# Patient Record
Sex: Female | Born: 1960
Health system: Southern US, Community
[De-identification: ages and names within clinical notes are randomized; demographics above are authoritative.]

## PROBLEM LIST (undated history)

## (undated) DIAGNOSIS — F329 Major depressive disorder, single episode, unspecified: Secondary | ICD-10-CM

## (undated) DIAGNOSIS — F32A Depression, unspecified: Secondary | ICD-10-CM

## (undated) DIAGNOSIS — K635 Polyp of colon: Secondary | ICD-10-CM

## (undated) DIAGNOSIS — E785 Hyperlipidemia, unspecified: Secondary | ICD-10-CM

## (undated) DIAGNOSIS — F419 Anxiety disorder, unspecified: Secondary | ICD-10-CM

## (undated) HISTORY — DX: Anxiety disorder, unspecified: F41.9

## (undated) HISTORY — PX: COLON SURGERY: SHX602

## (undated) HISTORY — PX: COSMETIC SURGERY: SHX468

## (undated) HISTORY — DX: Polyp of colon: K63.5

## (undated) HISTORY — DX: Depression, unspecified: F32.A

## (undated) HISTORY — PX: AUGMENTATION MAMMAPLASTY: SUR837

## (undated) HISTORY — PX: BREAST ENHANCEMENT SURGERY: SHX7

## (undated) HISTORY — PX: LAPAROSCOPIC COLON RESECTION: SUR791

## (undated) HISTORY — DX: Hyperlipidemia, unspecified: E78.5

---
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
## 2011-04-30 HISTORY — PX: VAGINOPLASTY: SHX329

## 2011-04-30 HISTORY — PX: ORCHIECTOMY: SHX2116

## 2013-11-25 DIAGNOSIS — E785 Hyperlipidemia, unspecified: Secondary | ICD-10-CM | POA: Insufficient documentation

## 2013-11-25 DIAGNOSIS — F419 Anxiety disorder, unspecified: Secondary | ICD-10-CM | POA: Insufficient documentation

## 2013-11-25 DIAGNOSIS — D126 Benign neoplasm of colon, unspecified: Secondary | ICD-10-CM | POA: Insufficient documentation

## 2017-10-11 ENCOUNTER — Encounter (HOSPITAL_COMMUNITY): Payer: Self-pay | Admitting: Emergency Medicine

## 2017-10-11 ENCOUNTER — Other Ambulatory Visit: Payer: Self-pay

## 2017-10-11 ENCOUNTER — Emergency Department (HOSPITAL_COMMUNITY)
Admission: EM | Admit: 2017-10-11 | Discharge: 2017-10-11 | Disposition: A | Discharge status: Home / Self Care | Payer: BLUE CROSS/BLUE SHIELD | Attending: Emergency Medicine | Admitting: Emergency Medicine

## 2017-10-11 DIAGNOSIS — Y929 Unspecified place or not applicable: Secondary | ICD-10-CM | POA: Diagnosis not present

## 2017-10-11 DIAGNOSIS — Y9389 Activity, other specified: Secondary | ICD-10-CM | POA: Diagnosis not present

## 2017-10-11 DIAGNOSIS — Z23 Encounter for immunization: Secondary | ICD-10-CM | POA: Insufficient documentation

## 2017-10-11 DIAGNOSIS — Y998 Other external cause status: Secondary | ICD-10-CM | POA: Insufficient documentation

## 2017-10-11 DIAGNOSIS — S61212A Laceration without foreign body of right middle finger without damage to nail, initial encounter: Secondary | ICD-10-CM | POA: Diagnosis present

## 2017-10-11 DIAGNOSIS — W260XXA Contact with knife, initial encounter: Secondary | ICD-10-CM | POA: Diagnosis not present

## 2017-10-11 MED ORDER — TETANUS-DIPHTH-ACELL PERTUSSIS 5-2.5-18.5 LF-MCG/0.5 IM SUSP
0.5000 mL | Freq: Once | INTRAMUSCULAR | Status: AC
  Administered 2017-10-11: 0.5 mL via INTRAMUSCULAR
  Filled 2017-10-11: qty 0.5

## 2017-10-11 MED ORDER — STERILE WATER FOR IRRIGATION IR SOLN: Freq: Once | Status: DC

## 2017-10-11 MED ORDER — LIDOCAINE HCL (PF) 1 % IJ SOLN
30.0000 mL | Freq: Once | INTRAMUSCULAR | Status: AC
  Administered 2017-10-11: 30 mL
  Filled 2017-10-11: qty 30

## 2017-10-11 NOTE — Discharge Instructions (Signed)
Your laceration was repaired with sutures today. You were given a tetanus shot. Tendon do not appear to be damaged.   Your sutures need to come out within 7-10 days  The original dressing should be left in place for 24-48 hours. Laceration can then be gently cleaned with mild soap and water after 24 hours of laceration repair to prevent crusting over the suture knots. An antibiotic ointment can be applied to the wound as well, twice daily until suture removal. Keep splint on for the next 48 hours to prevent further injury and help with bleeding control.  Your sutures are nonabsorbable sutures and you may shower or wash the wound with soap and water without risking increased rates of infection or disruption of the wound.  Although not well studied, prolonged soaking of nonabsorbable stitches including swimming in chlorinated water should be avoided because of the theoretical risk of premature loss of suture tensile strength with wound dehiscence. Patients with sutures should also not swim in natural bodies of water because of a potential increased risk of infection.   Return to the ER for worsening pain, redness, swelling, pus, fevers, complete loss of sensation of finger tip

## 2017-10-11 NOTE — ED Triage Notes (Signed)
Patient laceration on middle finger on right hand, accidental from a carpet knife. Hemorrhage controlled. Patient alert, oriented, and ambulating independently with steady gait.

## 2017-10-11 NOTE — Progress Notes (Signed)
Orthopedic Tech Progress Note Patient Details:  Jaclyn Rowe 01-23-1961 [id number]  Ortho Devices Type of Ortho Device: Finger splint Ortho Device/Splint Interventions: Application   Post Interventions Patient Tolerated: Well Instructions Provided: Care of device   [place] Pink 10/11/2017, 2:33 PM

## 2017-10-11 NOTE — ED Provider Notes (Signed)
[facility] EMERGENCY DEPARTMENT Provider Note   CSN: [id number] Arrival date & time: 10/11/17  1138     History   Chief Complaint Chief Complaint  Patient presents with  . Laceration    HPI Jaclyn Rowe is a 57 y.o. female here for evaluation of laceration to the right middle finger.  Patient was using a box cutter and put her finger.  She is left-hand dominant.  She went to urgent care where they told her she had an arterial bleed and sent her to the ER for repair.  She denies any paresthesias or numbness distally.  No anticoagulants.  Unknown tetanus status. Associated with local pain. Onset: sudden.   HPI  History reviewed. No pertinent past medical history.  There are no active problems to display for this patient.   History reviewed. No pertinent surgical history.   OB History   None      Home Medications    Prior to Admission medications   Not on File    Family History No family history on file.  Social History Social History   Tobacco Use  . Smoking status: Not on file  Substance Use Topics  . Alcohol use: Not on file  . Drug use: Not on file     Allergies   Patient has no known allergies.   Review of Systems Review of Systems  Skin: Positive for wound.  All other systems reviewed and are negative.    Physical Exam Updated Vital Signs BP 123/84 (BP Location: Right Arm)   Pulse 64   Temp 98.2 F (36.8 C) (Oral)   Resp 17   SpO2 99%   Physical Exam  Constitutional: She is oriented to person, place, and time. She appears well-developed and well-nourished.  Non-toxic appearance.  HENT:  Head: Normocephalic.  Right Ear: External ear normal.  Left Ear: External ear normal.  Nose: Nose normal.  Eyes: Conjunctivae and EOM are normal.  Neck: Full passive range of motion without pain.  Cardiovascular: Normal rate.  Finger tips warm   Pulmonary/Chest: Effort normal. No tachypnea. No respiratory distress.    Musculoskeletal: Normal range of motion.  5/5 strength with right middle finger flexion and extension at PIP, DIP and MCP   Neurological: She is alert and oriented to person, place, and time.  Sensation to light touch, 2 point discrimination intact bilaterally   Skin: Skin is warm and dry. Capillary refill takes less than 2 seconds.  3 cm laceration to dorsal aspect of right middle thumb across PIP, slow pulsatile bleed from tiny artery noted.   Psychiatric: Her behavior is normal. Thought content normal.     ED Treatments / Results  Labs (all labs ordered are listed, but only abnormal results are displayed) Labs Reviewed - No data to display  EKG None  Radiology No results found.  Procedures .Marland KitchenLaceration Repair Date/Time: 10/11/2017 2:00 PM Performed by: Kinnie Feil, PA-C Authorized by: Kinnie Feil, PA-C   Consent:    Consent obtained:  Verbal   Consent given by:  Patient   Risks discussed:  Infection, pain, retained foreign body, poor cosmetic result, tendon damage and vascular damage   Alternatives discussed:  Referral Anesthesia (see MAR for exact dosages):    Anesthesia method:  Local infiltration   Local anesthetic:  Lidocaine 1% w/o epi Laceration details:    Location:  Finger   Finger location:  R long finger   Length (cm):  3 Repair type:  Repair type:  Simple Pre-procedure details:    Preparation:  Patient was prepped and draped in usual sterile fashion Exploration:    Hemostasis achieved with:  Tourniquet and direct pressure   Wound exploration: wound explored through full range of motion and entire depth of wound probed and visualized     Wound extent: vascular damage     Wound extent: no nerve damage noted and no tendon damage noted     Contaminated: no   Treatment:    Area cleansed with:  Saline and Betadine   Amount of cleaning:  Standard   Irrigation solution:  Sterile saline   Irrigation volume:  200   Irrigation method:   Pressure wash, syringe and tap   Visualized foreign bodies/material removed: no   Skin repair:    Repair method:  Sutures   Suture size:  5-0   Suture material:  Prolene   Number of sutures:  6 Approximation:    Approximation:  Close Post-procedure details:    Dressing:  Antibiotic ointment, non-adherent dressing, bulky dressing and splint for protection   Patient tolerance of procedure:  Tolerated well, no immediate complications   (including critical care time)  Medications Ordered in ED Medications  lidocaine (PF) (XYLOCAINE) 1 % injection 30 mL (has no administration in time range)  sterile water for irrigation for irrigation (has no administration in time range)  Tdap (BOOSTRIX) injection 0.5 mL (0.5 mLs Intramuscular Given 10/11/17 1252)     Initial Impression / Assessment and Plan / ED Course  I have reviewed the triage vital signs and the nursing notes.  Pertinent labs & imaging results that were available during my care of the patient were reviewed by me and considered in my medical decision making (see chart for details).    57 year old female here with laceration to the dorsal aspect of her right middle anger.  Tdap booster.  Pressurization performed.  Bottom of the wound visualized with bleeding controlled, no foreign body seen or palpated.  There was a tiny arterial slow pulsating bleed noted that was controlled with tourniquet and direct pressure.  No tendon or nerve injury noted.  Full range of motion of the affected finger with full strength and no deficits.  Laceration was repaired without immediate complications.  Patient has no comorbidities to affect normal wound healing.  Discussed procedure, care with patient and answered questions.  Patient to follow-up with primary care doctor in 7 to 10 days for suture removal.  She is hemodynamically stable.  Final Clinical Impressions(s) / ED Diagnoses   Final diagnoses:  Laceration of right middle finger without foreign  body without damage to nail, initial encounter    ED Discharge Orders    None       Kinnie Feil, PA-C 10/11/17 1404    Lajean Saver, MD 10/11/17 1445

## 2017-12-05 ENCOUNTER — Encounter: Payer: Self-pay | Admitting: Family Medicine

## 2017-12-05 ENCOUNTER — Ambulatory Visit: Payer: BLUE CROSS/BLUE SHIELD | Admitting: Family Medicine

## 2017-12-05 VITALS — BP 110/72 | HR 73 | Temp 98.4°F | Resp 16 | Ht 70.0 in | Wt 205.8 lb

## 2017-12-05 DIAGNOSIS — Z789 Other specified health status: Secondary | ICD-10-CM

## 2017-12-05 DIAGNOSIS — Z79899 Other long term (current) drug therapy: Secondary | ICD-10-CM

## 2017-12-05 DIAGNOSIS — Z79890 Hormone replacement therapy (postmenopausal): Secondary | ICD-10-CM | POA: Diagnosis not present

## 2017-12-05 DIAGNOSIS — F640 Transsexualism: Secondary | ICD-10-CM

## 2017-12-05 DIAGNOSIS — F17218 Nicotine dependence, cigarettes, with other nicotine-induced disorders: Secondary | ICD-10-CM | POA: Diagnosis not present

## 2017-12-05 MED ORDER — ESTRADIOL 2 MG PO TABS: 2.0000 mg | ORAL_TABLET | Freq: Every day | ORAL | 3 refills | Status: DC

## 2017-12-05 NOTE — Progress Notes (Addendum)
Subjective:    Patient ID: Jaclyn Rowe, female    DOB: October 20, 1960, 57 y.o.   MRN: [id number] Chief Complaint  Patient presents with  . New pt  . Medication Refill    Estradiol 2 mg    HPI  Zamira is a delightful 57 yo woman who is here today to establish care.   She is a transgender female (assigned female at birth) who has had her gender-affirming hormones managed/rx'd by Dr. Vella Raring (ob-gyn in [place]) for the past 8 yrs until his retirement 4 mos ago.  She was initially on 4mg  of estradiol but dropped down to estradiol 2mg  qhs as her maintanence dose ever since her gender-affirming "bottom" surgery in 2013. Also s/p "top" surgery  Due for colonoscoy - prev was done at [facility] - had tubulovillous adenoma on last colonoscopy - but ok w/ going elsewhere - staying in [facility] now  PCP has been Dr. Tiana Loft in [facility] (google states Dr. Valora Piccolo is internist at [place]) but as Dr. Valora Piccolo does not rx gender-affirming hormone therapy Herlinda would prefer to just transfer here where she can receive both primary care and gender-affirming hormones rx from myself.  Tobacco abuse:  Did prev quit X 4 YRS when she initially started HRT bu using nicotine patches and gum - but then restarted due to stress from job - and now smoking up to 1.5 ppd. Never tried any chantix or wellbutrin prior.   Has been on lexapro 20 for years for anxiety - working well - no desire to change.  No past medical history on file. Past Surgical History:  Procedure Laterality Date  . COLON SURGERY    . COSMETIC SURGERY     Current Outpatient Medications on File Prior to Visit  Medication Sig Dispense Refill  . escitalopram (LEXAPRO) 10 MG tablet Take 10 mg by mouth daily.    . pravastatin (PRAVACHOL) 80 MG tablet Take 80 mg by mouth daily.     No current facility-administered medications on file prior to visit.    No Known Allergies No family history on  file. Social History   Socioeconomic History  . Marital status: Married    Spouse name: Not on file  . Number of children: Not on file  . Years of education: Not on file  . Highest education level: Not on file  Occupational History  . Not on file  Social Needs  . Financial resource strain: Not on file  . Food insecurity:    Worry: Not on file    Inability: Not on file  . Transportation needs:    Medical: Not on file    Non-medical: Not on file  Tobacco Use  . Smoking status: Current Every Day Smoker  . Smokeless tobacco: Never Used  Substance and Sexual Activity  . Alcohol use: Yes    Alcohol/week: 5.0 standard drinks    Types: 5 Standard drinks or equivalent per week  . Drug use: Not on file  . Sexual activity: Not on file  Lifestyle  . Physical activity:    Days per week: Not on file    Minutes per session: Not on file  . Stress: Not on file  Relationships  . Social connections:    Talks on phone: Not on file    Gets together: Not on file    Attends religious service: Not on file    Active member of club or organization: Not on file  Attends meetings of clubs or organizations: Not on file    Relationship status: Not on file  Other Topics Concern  . Not on file  Social History Narrative  . Not on file   No flowsheet data found.   Review of Systems See hpi    Objective:   Physical Exam  Constitutional: She is oriented to person, place, and time. She appears well-developed and well-nourished. No distress.  HENT:  Head: Normocephalic and atraumatic.  Right Ear: External ear normal.  Left Ear: External ear normal.  Eyes: Conjunctivae are normal. No scleral icterus.  Neck: Normal range of motion. Neck supple. No thyromegaly present.  Cardiovascular: Normal rate, regular rhythm, normal heart sounds and intact distal pulses.  Pulmonary/Chest: Effort normal and breath sounds normal. No respiratory distress.  Musculoskeletal: She exhibits no edema.   Lymphadenopathy:    She has no cervical adenopathy.  Neurological: She is alert and oriented to person, place, and time.  Skin: Skin is warm and dry. She is not diaphoretic. No erythema.  Psychiatric: She has a normal mood and affect. Her behavior is normal.   BP 110/72 (BP Location: Left Arm, Patient Position: Sitting, Cuff Size: Normal)   Pulse 73   Temp 98.4 F (36.9 C) (Oral)   Resp 16   Ht 5\' 10"  (1.778 m)   Wt 205 lb 12.8 oz (93.4 kg)   SpO2 96%   BMI 29.53 kg/m      Assessment & Plan:  Over 30 min spent in face-to-face evaluation of and consultation with patient and coordination of care.  Over 50% of this time was spent counseling this patient regarding above - risks/benefits o gender-affirming hormone therapy, esp post-gonadectomy with vigilant need to avoid osteoporosis.  1. Cigarette nicotine dependence with other nicotine-induced disorder - strongly encouraged cessation -estrogen dependence put pt at sig increased risk of DVT/PE reviewed in additional to the usu complicaitons - pt pre-contemplative due to stress of job - is likely candidate for screening lung CT - discuss at f/u and assess total pack yrs (though has [place] ins which often doesn't cover this under preventative in past experience)  2. Encounter for long-term current use of high risk medication   3. Hormone replacement therapy (HRT) - stable on estradiol 2mg  qhs since her orchiectomy in 2013 - 6 yrs - refilled. ADDENDUM: May consider increasing estradiol - in appropriate middle of post-menopausal range but likely has been at this level since ~57 yo and lower level may increase risk of developing osteoporosis so could do trial of 3mg  if pt prefers but would want her to quit smoking first since higher estrogen dose will also increase dvt/pe risk.  4. Female-to-female transgender person    Asked pt to sign release to get records from prior PCP Dr. Valora Piccolo and Dr. Vella Raring - gender specialist/ob-gyn.  Orders Placed This  Encounter  Procedures  . CBC with Differential/Platelet  . Comprehensive metabolic panel  . TestT+TestF+SHBG  . Estradiol    Meds ordered this encounter  Medications  . estradiol (ESTRACE) 2 MG tablet    Sig: Take 1 tablet (2 mg total) by mouth daily.    Dispense:  90 tablet    Refill:  3    Delman Cheadle, MD, MPH Primary Care at [place], [place]  [address] [phone] Office phone  [phone] Office fax   02/16/18 7:33 AM

## 2017-12-05 NOTE — Patient Instructions (Addendum)
Please stop by the front desk and sign a release so we can get your records from BOTH Dr. Vella Raring and Dr. Valora Piccolo in [facility].    IF you received an x-ray today, you will receive an invoice from [facility]. Please contact [facility] at [phone] with questions or concerns regarding your invoice.   IF you received labwork today, you will receive an invoice from [facility]. Please contact [facility] at [phone] with questions or concerns regarding your invoice.   Our billing staff will not be able to assist you with questions regarding bills from these companies.  You will be contacted with the lab results as soon as they are available. The fastest way to get your results is to activate your My Chart account. Instructions are located on the last page of this paperwork. If you have not heard from [place] regarding the results in 2 weeks, please contact this office.     Tobacco Use Disorder Tobacco use disorder (TUD) is a mental disorder. It is the long-term use of tobacco in spite of related health problems or difficulty with normal life activities. Tobacco is most commonly smoked as cigarettes and less commonly as cigars or pipes. Smokeless chewing tobacco and snuff are also popular. People with TUD get a feeling of extreme pleasure (euphoria) from using tobacco and have a desire to use it again and again. Repeated use of tobacco can cause problems. The addictive effects of tobacco are due mainly tothe ingredient nicotine. Nicotine also causes a rush of adrenaline (epinephrine) in the body. This leads to increased blood pressure, heart rate, and breathing rate. These changes may cause problems for people with high blood pressure, weak hearts, or lung disease. High doses of nicotine in children and pets can lead to seizures and death. Tobacco contains a number of other unsafe chemicals. These chemicals are especially harmful when inhaled as smoke and can damage almost every organ in  the body. Smokers live shorter lives than nonsmokers and are at risk of dying from a number of diseases and cancers. Tobacco smoke can also cause health problems for nonsmokers (due to inhaling secondhand smoke). Smoking is also a fire hazard. TUD usually starts in the late teenage years and is most common in young adults between the ages of 29 and 39 years. People who start smoking earlier in life are more likely to continue smoking as adults. TUD is somewhat more common in men than women. People with TUD are at higher risk for using alcohol and other drugs of abuse. What increases the risk? Risk factors for TUD include:  Having family members with the disorder.  Being around people who use tobacco.  Having an existing mental health issue such as schizophrenia, depression, bipolar disorder, ADHD, or posttraumatic stress disorder (PTSD).  What are the signs or symptoms? People with tobacco use disorder have two or more of the following signs and symptoms within 12 months:  Use of more tobacco over a longer period than intended.  Not able to cut down or control tobacco use.  A lot of time spent obtaining or using tobacco.  Strong desire or urge to use tobacco (craving). Cravings may last for 6 months or longer after quitting.  Use of tobacco even when use leads to major problems at work, school, or home.  Use of tobacco even when use leads to relationship problems.  Giving up or cutting down on important life activities because of tobacco use.  Repeatedly using tobacco in situations where it  puts you or others in physical danger, like smoking in bed.  Use of tobacco even when it is known that a physical or mental problem is likely related to tobacco use. ? Physical problems are numerous and may include chronic bronchitis, emphysema, lung and other cancers, gum disease, high blood pressure, heart disease, and stroke. ? Mental problems caused by tobacco may include difficulty sleeping  and anxiety.  Need to use greater amounts of tobacco to get the same effect. This means you have developed a tolerance.  Withdrawal symptoms as a result of stopping or rapidly cutting back use. These symptoms may last a month or more after quitting and include the following: ? Depressed, anxious, or irritable mood. ? Difficulty concentrating. ? Increased appetite. ? Restlessness or trouble sleeping. ? Use of tobacco to avoid withdrawal symptoms.  How is this diagnosed? Tobacco use disorder is diagnosed by your health care provider. A diagnosis may be made by:  Your health care provider asking questions about your tobacco use and any problems it may be causing.  A physical exam.  Lab tests.  You may be referred to a mental health professional or addiction specialist.  The severity of tobacco use disorder depends on the number of signs and symptoms you have:  Mild-Two or three symptoms.  Moderate-Four or five symptoms.  Severe-Six or more symptoms.  How is this treated? Many people with tobacco use disorder are unable to quit on their own and need help. Treatment options include the following:  Nicotine replacement therapy (NRT). NRT provides nicotine without the other harmful chemicals in tobacco. NRT gradually lowers the dosage of nicotine in the body and reduces withdrawal symptoms. NRT is available in over-the-counter forms (gum, lozenges, and skin patches) as well as prescription forms (mouth inhaler and nasal spray).  Medicines.This may include: ? Antidepressant medicine that may reduce nicotine cravings. ? A medicine that acts on nicotine receptors in the brain to reduce cravings and withdrawal symptoms. It may also block the effects of tobacco in people with TUD who relapse.  Counseling or talk therapy. A form of talk therapy called behavioral therapy is commonly used to treat people with TUD. Behavioral therapy looks at triggers for tobacco use, how to avoid them, and  how to cope with cravings. It is most effective in person or by phone but is also available in self-help forms (books and Internet websites).  Support groups. These provide emotional support, advice, and guidance for quitting tobacco.  The most effective treatment for TUD is usually a combination of medicine, talk therapy, and support groups. Follow these instructions at home:  Keep all follow-up visits as directed by your health care provider. This is important.  Take medicines only as directed by your health care provider.  Check with your health care provider before starting new prescription or over-the-counter medicines. Contact a health care provider if:  You are not able to take your medicines as prescribed.  Treatment is not helping your TUD and your symptoms get worse. Get help right away if:  You have serious thoughts about hurting yourself or others.  You have trouble breathing, chest pain, sudden weakness, or sudden numbness in part of your body. This information is not intended to replace advice given to you by your health care provider. Make sure you discuss any questions you have with your health care provider. Document Released: 12/20/2003 Document Revised: 12/17/2015 Document Reviewed: 06/11/2013 Elsevier Interactive Patient Education  [employer].

## 2017-12-08 LAB — CBC WITH DIFFERENTIAL/PLATELET
BASOS: 0 %
Basophils Absolute: 0 10*3/uL (ref 0.0–0.2)
EOS (ABSOLUTE): 0.2 10*3/uL (ref 0.0–0.4)
EOS: 2 %
HEMATOCRIT: 44.6 % (ref 34.0–46.6)
Hemoglobin: 15.4 g/dL (ref 11.1–15.9)
IMMATURE GRANS (ABS): 0 10*3/uL (ref 0.0–0.1)
IMMATURE GRANULOCYTES: 0 %
Lymphocytes Absolute: 1.5 10*3/uL (ref 0.7–3.1)
Lymphs: 24 %
MCH: 32.9 pg (ref 26.6–33.0)
MCHC: 34.5 g/dL (ref 31.5–35.7)
MCV: 95 fL (ref 79–97)
MONOS ABS: 0.6 10*3/uL (ref 0.1–0.9)
Monocytes: 9 %
NEUTROS ABS: 4 10*3/uL (ref 1.4–7.0)
NEUTROS PCT: 65 %
Platelets: 226 10*3/uL (ref 150–450)
RBC: 4.68 x10E6/uL (ref 3.77–5.28)
RDW: 13.5 % (ref 12.3–15.4)
WBC: 6.2 10*3/uL (ref 3.4–10.8)

## 2017-12-08 LAB — COMPREHENSIVE METABOLIC PANEL
A/G RATIO: 1.9 (ref 1.2–2.2)
ALT: 11 IU/L (ref 0–32)
AST: 13 IU/L (ref 0–40)
Albumin: 4.3 g/dL (ref 3.5–5.5)
Alkaline Phosphatase: 56 IU/L (ref 39–117)
BUN/Creatinine Ratio: 18 (ref 9–23)
BUN: 15 mg/dL (ref 6–24)
Bilirubin Total: 0.3 mg/dL (ref 0.0–1.2)
CALCIUM: 9.2 mg/dL (ref 8.7–10.2)
CO2: 22 mmol/L (ref 20–29)
Chloride: 104 mmol/L (ref 96–106)
Creatinine, Ser: 0.84 mg/dL (ref 0.57–1.00)
GFR calc Af Amer: 90 mL/min/{1.73_m2} (ref >59)
GFR, EST NON AFRICAN AMERICAN: 78 mL/min/{1.73_m2} (ref >59)
Globulin, Total: 2.3 g/dL (ref 1.5–4.5)
Glucose: 84 mg/dL (ref 65–99)
POTASSIUM: 4.1 mmol/L (ref 3.5–5.2)
SODIUM: 141 mmol/L (ref 134–144)
TOTAL PROTEIN: 6.6 g/dL (ref 6.0–8.5)

## 2017-12-08 LAB — TESTT+TESTF+SHBG
Sex Hormone Binding: 72.9 nmol/L (ref 17.3–125.0)
Testosterone, Free: 2.8 pg/mL (ref 0.0–4.2)
Testosterone, total: 25 ng/dL

## 2017-12-08 LAB — ESTRADIOL: Estradiol: 25.9 pg/mL

## 2018-02-17 ENCOUNTER — Other Ambulatory Visit: Payer: Self-pay | Admitting: Family Medicine

## 2018-02-17 NOTE — Telephone Encounter (Signed)
Copied from [place] [id number]. Topic: Quick Communication - Rx Refill/Question >> Feb 17, 2018 11:35 AM Leward Quan A wrote: Medication: escitalopram (LEXAPRO) 10 MG tablet  Patient is completely out of medication  Has the patient contacted their pharmacy? Yes.     Preferred Pharmacy (with phone number or street name): [id number], [place] [phone] (Phone) [phone] (Fax)   Agent: Please be advised that RX refills may take up to 3 business days. We ask that you follow-up with your pharmacy.

## 2018-02-17 NOTE — Telephone Encounter (Signed)
Rx request of historical medication: Lexapro 10 mg    Historical provider  LOV: 12/05/17  PCP: [place]: verified

## 2018-02-18 ENCOUNTER — Other Ambulatory Visit: Payer: Self-pay | Admitting: Family Medicine

## 2018-02-19 ENCOUNTER — Other Ambulatory Visit: Payer: Self-pay | Admitting: Family Medicine

## 2018-02-19 NOTE — Telephone Encounter (Signed)
Requested medication (s) are due for refill today: yes  Requested medication (s) are on the active medication list: yes  Last refill:  Last filled by historical provider  Future visit scheduled: yes 06/08/18  Notes to clinic:  Medication last filled by historical provider. LOV: 12/05/17    Requested Prescriptions  Pending Prescriptions Disp Refills   escitalopram (LEXAPRO) 20 MG tablet [Pharmacy Med Name: ESCITALOPRAM 20MG  TABLETS] 90 tablet 0    Sig: TAKE 1 TABLET BY MOUTH DAILY     Psychiatry:  Antidepressants - SSRI Passed - 02/19/2018  5:05 PM      Passed - Valid encounter within last 6 months    Recent Outpatient Visits          2 months ago Tobacco abuse   [facility] at [facility], Laurey Arrow, MD      Future Appointments            In 3 months Shawnee Knapp, MD Primary Care at [place], [facility]

## 2018-02-20 ENCOUNTER — Telehealth: Payer: Self-pay

## 2018-02-20 MED ORDER — ESCITALOPRAM OXALATE 10 MG PO TABS: 10.0000 mg | ORAL_TABLET | Freq: Every day | ORAL | 1 refills | Status: DC

## 2018-02-20 NOTE — Telephone Encounter (Signed)
Refill x 6 mos sent to [facility] in [place]

## 2018-02-20 NOTE — Telephone Encounter (Signed)
Pharmacy would like clarification on Lexapro refill.  Pt has been on 20mg  through another practice for at least one year.  Refill was submitted for 10mg .  Please clarify which dose is correct.

## 2018-02-21 ENCOUNTER — Encounter: Payer: Self-pay | Admitting: Family Medicine

## 2018-02-21 DIAGNOSIS — Z79899 Other long term (current) drug therapy: Secondary | ICD-10-CM | POA: Insufficient documentation

## 2018-02-21 DIAGNOSIS — F17200 Nicotine dependence, unspecified, uncomplicated: Secondary | ICD-10-CM | POA: Insufficient documentation

## 2018-02-21 DIAGNOSIS — Z789 Other specified health status: Secondary | ICD-10-CM | POA: Insufficient documentation

## 2018-02-21 DIAGNOSIS — F640 Transsexualism: Secondary | ICD-10-CM | POA: Insufficient documentation

## 2018-02-21 DIAGNOSIS — Z79890 Hormone replacement therapy (postmenopausal): Secondary | ICD-10-CM | POA: Insufficient documentation

## 2018-02-21 MED ORDER — ESCITALOPRAM OXALATE 20 MG PO TABS: 20.0000 mg | ORAL_TABLET | Freq: Every day | ORAL | 1 refills | Status: DC

## 2018-02-21 NOTE — Telephone Encounter (Addendum)
[facility] pharmacy called - I was just intending to refill her current rx per the 10/22 refill request I was sent.   Looks like the med dose must have been entered wrong when she was triaged at her last visit and then not clarified when she called in for refill. Sent in new rx to pharm for lexapro 20mg  qd x 6 mos and cancelled the 10.

## 2018-02-21 NOTE — Addendum Note (Signed)
Addended by: Shawnee Knapp on: 02/21/2018 02:52 AM   Modules accepted: Orders

## 2018-02-23 ENCOUNTER — Encounter: Payer: Self-pay | Admitting: *Deleted

## 2018-03-18 ENCOUNTER — Telehealth: Payer: Self-pay | Admitting: Family Medicine

## 2018-03-18 NOTE — Telephone Encounter (Signed)
Jaclyn Rowe alerted me that pt has been having a hard time getting through the portal and needs to speak with me about her lexapro as it needs to be increased (been on same dosage for years and now ineffective, she could probably use some Buspar. She is doing EMDR with Dawn at [place].  I noted that I would f/u.  No one is signed on pt's release so cannot share info w/ [name]. Pt noted ok to leave detailed message on her cell phone [phone] so called and LVM that I was told to reach out to pt about her worsening anxiety to see if there was a quick intervention/med change we could do now and then f/u on effects and next steps at her OV sched for 12/20. Asked pt to call back or MyChart in with good time to reach her. Unfortunately, likely can't get pt in for an OV prior to that.  Also alerted pt that I would text her info for MyChart app activation and did so.  --------------------------------------------- Please talk to pt and let her know: On max dose lexapro now as was increased back to 20mg  qd last mo at pt's request when she called in for refill (had prev been decreased to 10 at pt's last and only OV w/ me 11/2017 when she reporteded her lexapro was working well and anxiety well controlled) Want to make sure pt received the 20mg  dose now and if she is still having problems with anxiety mainly (rahter than depression), trial of low dose buspar 5mg  bid is reasonable - increase to tid after 1 wk if tolerated.  Then at upcoming visit can discuss tapering off of lexapro and onto something else and/or increasing buspar further.

## 2018-03-19 ENCOUNTER — Encounter: Payer: Self-pay | Admitting: Family Medicine

## 2018-03-19 NOTE — Telephone Encounter (Signed)
LVM for pt to call back within 5 minutes (from 10:00 AM) in order to schedule an appt with Dr. Brigitte Pulse today 03/19/18 at 1:30 due to a cancellation. I advised on VM that I would have to move on to another pt if I didn't hear back within 5 minutes.

## 2018-03-25 ENCOUNTER — Encounter: Payer: Self-pay | Admitting: Family Medicine

## 2018-04-17 ENCOUNTER — Encounter: Payer: Self-pay | Admitting: Family Medicine

## 2018-04-17 ENCOUNTER — Other Ambulatory Visit: Payer: Self-pay

## 2018-04-17 ENCOUNTER — Ambulatory Visit (INDEPENDENT_AMBULATORY_CARE_PROVIDER_SITE_OTHER): Payer: BLUE CROSS/BLUE SHIELD | Admitting: Family Medicine

## 2018-04-17 VITALS — BP 105/69 | HR 95 | Temp 98.0°F | Resp 16 | Ht 69.29 in | Wt 207.0 lb

## 2018-04-17 DIAGNOSIS — F17218 Nicotine dependence, cigarettes, with other nicotine-induced disorders: Secondary | ICD-10-CM | POA: Diagnosis not present

## 2018-04-17 DIAGNOSIS — Z113 Encounter for screening for infections with a predominantly sexual mode of transmission: Secondary | ICD-10-CM

## 2018-04-17 DIAGNOSIS — F331 Major depressive disorder, recurrent, moderate: Secondary | ICD-10-CM | POA: Diagnosis not present

## 2018-04-17 DIAGNOSIS — F17200 Nicotine dependence, unspecified, uncomplicated: Secondary | ICD-10-CM | POA: Diagnosis not present

## 2018-04-17 DIAGNOSIS — F640 Transsexualism: Secondary | ICD-10-CM

## 2018-04-17 DIAGNOSIS — Z79899 Other long term (current) drug therapy: Secondary | ICD-10-CM

## 2018-04-17 DIAGNOSIS — F419 Anxiety disorder, unspecified: Secondary | ICD-10-CM | POA: Diagnosis not present

## 2018-04-17 DIAGNOSIS — Z789 Other specified health status: Secondary | ICD-10-CM

## 2018-04-17 DIAGNOSIS — D126 Benign neoplasm of colon, unspecified: Secondary | ICD-10-CM

## 2018-04-17 DIAGNOSIS — Z114 Encounter for screening for human immunodeficiency virus [HIV]: Secondary | ICD-10-CM

## 2018-04-17 DIAGNOSIS — Z1329 Encounter for screening for other suspected endocrine disorder: Secondary | ICD-10-CM

## 2018-04-17 MED ORDER — BUPROPION HCL ER (SR) 150 MG PO TB12: 150.0000 mg | ORAL_TABLET | Freq: Two times a day (BID) | ORAL | 1 refills | Status: DC

## 2018-04-17 NOTE — Progress Notes (Signed)
Subjective:    Patient: Jaclyn Rowe  DOB: 11/16/1960; 57 y.o.   MRN: [id number]  Chief Complaint  Patient presents with  . Medication Management    possible increase of the Lexapro     HPI  Still has some lingering depression - blah, lethargic, thoughts depressed - not her. EMDR is magic - helping tremendously with past trauma and anxiety but has seemed to reach a plateau. Seeing Dawn at [facility]. Thinking with all the trauma was normal - has been schlepping around baggage which is now removed and so noticing that is left still with some mild depression.  Not enjoying things she used to enjoy - not playing guitar or riding motorcycles - doesn't feel like it - impending sense of doom and gloom. Sleeping as physically tired.  Sometimes sleeping more to escape. Concentration normal but is more irritable. Appetite ok.  Anxiety related to high stress situations.   Has been on buspirone prior which didn't work.  Patient did try BuSpar 7.5 mg twice daily With Dr. Valora Piccolo in February 2016.  Hep C screen neg 11/02/15  Still smoking the smoking 1 1/2 ppd. She did stop smoking for 4 years by cold [place] around 2017.  She gets a rash with nicotine patches and has been afraid to try Chantix due to the side effect profile.    Her prior PCP Dr. Valora Piccolo did refer her to gastroenterology as she was due for colon cancer screening at her last visit with him 07/16/2017 noting that prior colonoscopy had been done 01/24/2014  Medical History History reviewed. No pertinent past medical history. Past Surgical History:  Procedure Laterality Date  . BREAST ENHANCEMENT SURGERY Bilateral   . COLON SURGERY    . COSMETIC SURGERY    . ORCHIECTOMY Bilateral 2013  . VAGINOPLASTY  2013   Current Outpatient Medications on File Prior to Visit  Medication Sig Dispense Refill  . escitalopram (LEXAPRO) 20 MG tablet Take 1 tablet (20 mg total) by mouth daily. 90 tablet 1  . estradiol (ESTRACE) 2 MG tablet Take 1 tablet  (2 mg total) by mouth daily. 90 tablet 3  . pravastatin (PRAVACHOL) 80 MG tablet Take 80 mg by mouth daily.     No current facility-administered medications on file prior to visit.    No Known Allergies History reviewed. No pertinent family history. Social History   Socioeconomic History  . Marital status: Married    Spouse name: Not on file  . Number of children: Not on file  . Years of education: Not on file  . Highest education level: Not on file  Occupational History  . Not on file  Social Needs  . Financial resource strain: Not on file  . Food insecurity:    Worry: Not on file    Inability: Not on file  . Transportation needs:    Medical: Not on file    Non-medical: Not on file  Tobacco Use  . Smoking status: Current Every Day Smoker    Packs/day: 1.50  . Smokeless tobacco: Never Used  Substance and Sexual Activity  . Alcohol use: Yes    Alcohol/week: 5.0 standard drinks    Types: 5 Standard drinks or equivalent per week  . Drug use: Not on file  . Sexual activity: Not on file  Lifestyle  . Physical activity:    Days per week: Not on file    Minutes per session: Not on file  . Stress: Not on file  Relationships  .  Social connections:    Talks on phone: Not on file    Gets together: Not on file    Attends religious service: Not on file    Active member of club or organization: Not on file    Attends meetings of clubs or organizations: Not on file    Relationship status: Not on file  Other Topics Concern  . Not on file  Social History Narrative  . Not on file   Depression screen [facility] 2/9 04/17/2018  Decreased Interest 2  Down, Depressed, Hopeless 2  PHQ - 2 Score 4  Altered sleeping 0  Tired, decreased energy 1  Change in appetite 0  Feeling bad or failure about yourself  2  Trouble concentrating 0  Moving slowly or fidgety/restless 0  Suicidal thoughts 0  PHQ-9 Score 7    ROS As noted in HPI  Objective:  BP 105/69   Pulse 95   Temp 98 F  (36.7 C) (Oral)   Resp 16   Ht 5' 9.29" (1.76 m)   Wt 207 lb (93.9 kg)   SpO2 99%   BMI 30.31 kg/m  Physical Exam Constitutional:      General: She is not in acute distress.    Appearance: She is well-developed. She is not diaphoretic.  HENT:     Head: Normocephalic and atraumatic.     Right Ear: External ear normal.     Left Ear: External ear normal.  Eyes:     General: No scleral icterus.    Conjunctiva/sclera: Conjunctivae normal.  Neck:     Musculoskeletal: Normal range of motion and neck supple.     Thyroid: No thyromegaly.  Cardiovascular:     Rate and Rhythm: Normal rate and regular rhythm.     Heart sounds: Normal heart sounds.  Pulmonary:     Effort: Pulmonary effort is normal. No respiratory distress.     Breath sounds: Normal breath sounds.  Lymphadenopathy:     Cervical: No cervical adenopathy.  Skin:    General: Skin is warm and dry.     Findings: No erythema.  Neurological:     Mental Status: She is alert and oriented to person, place, and time.  Psychiatric:        Behavior: Behavior normal.      Assessment & Plan:   1. Moderate episode of recurrent major depressive disorder ([place])   2. Anxiety   3. Tobacco use disorder   4. Cigarette nicotine dependence with other nicotine-induced disorder   5. Routine screening for STI (sexually transmitted infection)   6. Screening for HIV (human immunodeficiency virus)   7. Screening for thyroid disorder   8. Encounter for long-term current use of high risk medication   9. Female-to-female transgender person   67. Tubulovillous adenoma of colon     Patient will continue on current chronic medications other than changes noted above, so ok to refill when needed.   See after visit summary for patient specific instructions.  Orders Placed This Encounter  Procedures  . HIV Antibody (routine testing w rflx)    Standing Status:   Future    Standing Expiration Date:   04/18/2019  . TSH    Standing Status:    Future    Standing Expiration Date:   04/18/2019  . Ambulatory Referral for Lung Cancer Scre    Referral Priority:   Routine    Referral Type:   Consultation    Referral Reason:   Specialty Services Required  Number of Visits Requested:   1    Meds ordered this encounter  Medications  . buPROPion (WELLBUTRIN SR) 150 MG 12 hr tablet    Sig: Take 1 tablet (150 mg total) by mouth 2 (two) times daily.    Dispense:  60 tablet    Refill:  1    Patient verbalized to me that they understand the following: diagnosis, what is being done for them, what to expect and what should be done at home.  Their questions have been answered. They understand that I am unable to predict every possible medication interaction or adverse outcome and that if any unexpected symptoms arise, they should contact us and their pharmacist, as well as never hesitate to seek urgent/emergent care at [facility] or ER if they think it might be warranted.    Delman Cheadle, MD, MPH Primary Care at [place] [address] [place], [place]  [address] [phone] Office phone  [phone] Office fax  04/17/18 10:34 AM

## 2018-04-17 NOTE — Patient Instructions (Signed)
° ° ° °  If you have lab work done today you will be contacted with your lab results within the next 2 weeks.  If you have not heard from us then please contact us. The fastest way to get your results is to register for My Chart. ° ° °IF you received an x-ray today, you will receive an invoice from [facility]. Please contact [facility] at [phone] with questions or concerns regarding your invoice.  ° °IF you received labwork today, you will receive an invoice from [facility]. Please contact [facility] at [phone] with questions or concerns regarding your invoice.  ° °Our billing staff will not be able to assist you with questions regarding bills from these companies. ° °You will be contacted with the lab results as soon as they are available. The fastest way to get your results is to activate your My Chart account. Instructions are located on the last page of this paperwork. If you have not heard from us regarding the results in 2 weeks, please contact this office. °  ° ° ° °

## 2018-05-15 ENCOUNTER — Telehealth: Payer: Self-pay | Admitting: Family Medicine

## 2018-05-15 NOTE — Telephone Encounter (Signed)
MyChart message sent to pt about their appointment on 06/08/18 with Dr Brigitte Pulse

## 2018-05-29 ENCOUNTER — Ambulatory Visit: Payer: BLUE CROSS/BLUE SHIELD | Admitting: Family Medicine

## 2018-06-07 ENCOUNTER — Other Ambulatory Visit: Payer: Self-pay | Admitting: Family Medicine

## 2018-06-08 ENCOUNTER — Encounter: Payer: BLUE CROSS/BLUE SHIELD | Admitting: Family Medicine

## 2018-06-08 NOTE — Telephone Encounter (Signed)
Requested Prescriptions  Pending Prescriptions Disp Refills  . buPROPion (WELLBUTRIN SR) 150 MG 12 hr tablet [Pharmacy Med Name: BUPROPION SR 150MG  TABLETS (12 H)] 60 tablet 1    Sig: TAKE 1 TABLET(150 MG) BY MOUTH TWICE DAILY     Psychiatry: Antidepressants - bupropion Passed - 06/07/2018 11:04 AM      Passed - Last BP in normal range    BP Readings from Last 1 Encounters:  04/17/18 105/69         Passed - Valid encounter within last 6 months    Recent Outpatient Visits          1 month ago Moderate episode of recurrent major depressive disorder [facility])   [facility], Laurey Arrow, MD   6 months ago Cigarette nicotine dependence with other nicotine-induced disorder   [facility], Laurey Arrow, MD

## 2018-06-15 NOTE — Telephone Encounter (Signed)
Requested medication (s) are due for refill today: not sure  Requested medication (s) are on the active medication list: yes  Last refill:  No date  Future visit scheduled: no  Notes to clinic:  antilipid statins failed. Prescribed by historical provider.   Requested Prescriptions  Pending Prescriptions Disp Refills   pravastatin (PRAVACHOL) 80 MG tablet      Sig: Take 1 tablet (80 mg total) by mouth daily.     Cardiovascular:  Antilipid - Statins Failed - 06/15/2018 11:36 AM      Failed - Total Cholesterol in normal range and within 360 days    No results found for: CHOL, POCCHOL       Failed - LDL in normal range and within 360 days    No results found for: LDLCALC, LDLC, HIRISKLDL       Failed - HDL in normal range and within 360 days    No results found for: HDL       Failed - Triglycerides in normal range and within 360 days    No results found for: TRIG       Passed - Patient is not pregnant      Passed - Valid encounter within last 12 months    Recent Outpatient Visits          1 month ago Moderate episode of recurrent major depressive disorder [facility])   [facility] at [facility], Laurey Arrow, MD   6 months ago Cigarette nicotine dependence with other nicotine-induced disorder   [facility] at [facility], Laurey Arrow, MD           Signed Prescriptions Disp Refills   buPROPion (WELLBUTRIN SR) 150 MG 12 hr tablet 60 tablet 1    Sig: TAKE 1 TABLET(150 MG) BY MOUTH TWICE DAILY     Psychiatry: Antidepressants - bupropion Passed - 06/07/2018 11:04 AM      Passed - Last BP in normal range    BP Readings from Last 1 Encounters:  04/17/18 105/69         Passed - Valid encounter within last 6 months    Recent Outpatient Visits          1 month ago Moderate episode of recurrent major depressive disorder [facility])   Primary Care at [place], MD   6 months ago Cigarette nicotine dependence with other nicotine-induced disorder   [facility] at [facility], Laurey Arrow, MD

## 2018-06-15 NOTE — Telephone Encounter (Signed)
Copied from [place] [id number]. Topic: Quick Communication - Rx Refill/Question >> Jun 15, 2018 11:17 AM Lionel December wrote: Medication: pravastatin (PRAVACHOL) 80 MG tablet  Has the patient contacted their pharmacy? Yes.   (Agent: If no, request that the patient contact the pharmacy for the refill.) (Agent: If yes, when and what did the pharmacy advise?)  Preferred Pharmacy (with phone number or street name): [id number] [place] [phone] (Phone) [phone] (Fax)    Agent: Please be advised that RX refills may take up to 3 business days. We ask that you follow-up with your pharmacy.

## 2018-06-16 MED ORDER — PRAVASTATIN SODIUM 80 MG PO TABS: 80.0000 mg | ORAL_TABLET | Freq: Every day | ORAL | 0 refills | Status: DC

## 2018-07-06 ENCOUNTER — Other Ambulatory Visit: Payer: Self-pay | Admitting: Acute Care

## 2018-07-06 DIAGNOSIS — F17210 Nicotine dependence, cigarettes, uncomplicated: Secondary | ICD-10-CM

## 2018-07-06 DIAGNOSIS — Z122 Encounter for screening for malignant neoplasm of respiratory organs: Secondary | ICD-10-CM

## 2018-07-18 ENCOUNTER — Other Ambulatory Visit: Payer: Self-pay | Admitting: Family Medicine

## 2018-07-31 ENCOUNTER — Other Ambulatory Visit: Payer: Self-pay | Admitting: Family Medicine

## 2018-08-03 ENCOUNTER — Ambulatory Visit: Payer: Self-pay

## 2018-08-03 ENCOUNTER — Encounter: Payer: Self-pay | Admitting: Acute Care

## 2018-08-15 ENCOUNTER — Other Ambulatory Visit: Payer: Self-pay | Admitting: Family Medicine

## 2018-08-15 NOTE — Telephone Encounter (Signed)
Can pt have a refill on this med 

## 2018-09-14 ENCOUNTER — Other Ambulatory Visit: Payer: Self-pay | Admitting: Family Medicine

## 2018-09-22 ENCOUNTER — Encounter: Payer: Self-pay | Admitting: Registered Nurse

## 2018-09-23 ENCOUNTER — Other Ambulatory Visit: Payer: Self-pay | Admitting: *Deleted

## 2018-09-23 MED ORDER — PRAVASTATIN SODIUM 80 MG PO TABS: 80.0000 mg | ORAL_TABLET | Freq: Every day | ORAL | 0 refills | Status: DC

## 2018-09-23 MED ORDER — ESCITALOPRAM OXALATE 20 MG PO TABS: 20.0000 mg | ORAL_TABLET | Freq: Every day | ORAL | 0 refills | Status: DC

## 2018-09-28 ENCOUNTER — Encounter: Payer: Self-pay | Admitting: Registered Nurse

## 2018-09-28 ENCOUNTER — Ambulatory Visit (INDEPENDENT_AMBULATORY_CARE_PROVIDER_SITE_OTHER): Payer: BLUE CROSS/BLUE SHIELD | Admitting: Registered Nurse

## 2018-09-28 ENCOUNTER — Other Ambulatory Visit: Payer: Self-pay

## 2018-09-28 VITALS — BP 116/72 | HR 68 | Temp 98.0°F | Resp 18 | Ht 69.0 in | Wt 200.0 lb

## 2018-09-28 DIAGNOSIS — F331 Major depressive disorder, recurrent, moderate: Secondary | ICD-10-CM

## 2018-09-28 DIAGNOSIS — Z79899 Other long term (current) drug therapy: Secondary | ICD-10-CM | POA: Diagnosis not present

## 2018-09-28 DIAGNOSIS — Z789 Other specified health status: Secondary | ICD-10-CM

## 2018-09-28 DIAGNOSIS — Z7689 Persons encountering health services in other specified circumstances: Secondary | ICD-10-CM

## 2018-09-28 DIAGNOSIS — Z13220 Encounter for screening for lipoid disorders: Secondary | ICD-10-CM

## 2018-09-28 DIAGNOSIS — F640 Transsexualism: Secondary | ICD-10-CM | POA: Diagnosis not present

## 2018-09-28 DIAGNOSIS — Z1329 Encounter for screening for other suspected endocrine disorder: Secondary | ICD-10-CM

## 2018-09-28 DIAGNOSIS — E785 Hyperlipidemia, unspecified: Secondary | ICD-10-CM

## 2018-09-28 DIAGNOSIS — Z79890 Hormone replacement therapy (postmenopausal): Secondary | ICD-10-CM

## 2018-09-28 DIAGNOSIS — D126 Benign neoplasm of colon, unspecified: Secondary | ICD-10-CM

## 2018-09-28 MED ORDER — PRAVASTATIN SODIUM 80 MG PO TABS: 80.0000 mg | ORAL_TABLET | Freq: Every day | ORAL | 0 refills | Status: DC

## 2018-09-28 MED ORDER — BUPROPION HCL ER (SR) 150 MG PO TB12: ORAL_TABLET | ORAL | 0 refills | Status: DC

## 2018-09-28 MED ORDER — ESCITALOPRAM OXALATE 20 MG PO TABS: 20.0000 mg | ORAL_TABLET | Freq: Every day | ORAL | 0 refills | Status: DC

## 2018-09-28 MED ORDER — ESTRADIOL 2 MG PO TABS: 2.0000 mg | ORAL_TABLET | Freq: Every day | ORAL | 3 refills | Status: DC

## 2018-09-28 NOTE — Patient Instructions (Signed)
° ° ° °  If you have lab work done today you will be contacted with your lab results within the next 2 weeks.  If you have not heard from us then please contact us. The fastest way to get your results is to register for My Chart. ° ° °IF you received an x-ray today, you will receive an invoice from [facility]. Please contact [facility] at [phone] with questions or concerns regarding your invoice.  ° °IF you received labwork today, you will receive an invoice from [facility]. Please contact [facility] at [phone] with questions or concerns regarding your invoice.  ° °Our billing staff will not be able to assist you with questions regarding bills from these companies. ° °You will be contacted with the lab results as soon as they are available. The fastest way to get your results is to activate your My Chart account. Instructions are located on the last page of this paperwork. If you have not heard from us regarding the results in 2 weeks, please contact this office. °  ° ° ° °

## 2018-09-28 NOTE — Progress Notes (Signed)
Established Patient Office Visit  Subjective:  Patient ID: Jaclyn Rowe, female    DOB: [birth year]  Age: 58 y.o. MRN: [id number]  CC:  Chief Complaint  Patient presents with  . Transitions Of Care    need new PCP to manage medications     HPI Jaclyn Rowe presents to establish care with myself as PCP. She has a hx of hyperlipidemia, anxiety/depression, a smoking history, and a history of colon polyps.  Hyperlipidemia: managed with  Pravastatin 80mg  PO qd - checking lipids today  Anxiety/Depression: Managed with Lexapro 20mg  PO qd and Wellbutrin 150mg  XR PO qd. Good effect. Does not need change at this time.  Smoking hx: 1.5 PPD for 38 years - patient will qualify for screening CT, hoping today to focus on colonoscopy and tackling that aspect of preventative care.   Colon polyps: Had benign growth removed in 2014, was due for follow up colonoscopy in 2017. Lost to follow up with previous GI group, wishes to establish within [facility] today. Interested in [facility], however, we discussed that with her GI history, she may not qualify for this option. Referred to GI.  Pt also reports bump on her arm near L elbow lateral side - firm, moveable, nontender, no crusting, weeping, purulence. Been there long term without change. Likely lipoma, offered derm, pt declined.  Pt is female to female trans person, we discussed her current therapy of 2mg  Estradiol as being effective for her at this time. We discussed that I unfortunately will not be able to make changes to her therapy, but would be happy to continue her current dosage.    Past Medical History:  Diagnosis Date  . Polyp, colonic     Past Surgical History:  Procedure Laterality Date  . BREAST ENHANCEMENT SURGERY Bilateral   . COLON SURGERY    . COSMETIC SURGERY    . ORCHIECTOMY Bilateral 2013  . VAGINOPLASTY  2013    History reviewed. No pertinent family history.  Social History   Socioeconomic History  .  Marital status: Married    Spouse name: Not on file  . Number of children: 2  . Years of education: Not on file  . Highest education level: Not on file  Occupational History  . Not on file  Social Needs  . Financial resource strain: Not hard at all  . Food insecurity:    Worry: Never true    Inability: Never true  . Transportation needs:    Medical: No    Non-medical: No  Tobacco Use  . Smoking status: Current Every Day Smoker    Packs/day: 1.50    Years: 38.00    Pack years: 57.00  . Smokeless tobacco: Never Used  Substance and Sexual Activity  . Alcohol use: Yes    Alcohol/week: 15.0 standard drinks    Types: 15 Standard drinks or equivalent per week  . Drug use: Not Currently  . Sexual activity: Not on file  Lifestyle  . Physical activity:    Days per week: Not on file    Minutes per session: Not on file  . Stress: Not on file  Relationships  . Social connections:    Talks on phone: Not on file    Gets together: Not on file    Attends religious service: Not on file    Active member of club or organization: Not on file    Attends meetings of clubs or organizations: Not on file    Relationship status: Not on  file  . Intimate partner violence:    Fear of current or ex partner: Not on file    Emotionally abused: Not on file    Physically abused: Not on file    Forced sexual activity: Not on file  Other Topics Concern  . Not on file  Social History Narrative  . Not on file    Outpatient Medications Prior to Visit  Medication Sig Dispense Refill  . buPROPion (WELLBUTRIN SR) 150 MG 12 hr tablet TAKE 1 TABLET(150 MG) BY MOUTH TWICE DAILY 60 tablet 0  . escitalopram (LEXAPRO) 20 MG tablet Take 1 tablet (20 mg total) by mouth daily. 30 tablet 0  . estradiol (ESTRACE) 2 MG tablet Take 1 tablet (2 mg total) by mouth daily. 90 tablet 3  . pravastatin (PRAVACHOL) 80 MG tablet Take 1 tablet (80 mg total) by mouth daily. 90 tablet 0   No facility-administered medications  prior to visit.     No Known Allergies  ROS Review of Systems  Constitutional: Negative.   HENT: Negative.   Eyes: Negative.   Respiratory: Negative.   Cardiovascular: Negative.   Gastrointestinal: Negative.   Endocrine: Negative.   Genitourinary: Negative.   Musculoskeletal: Negative.   Skin: Negative.   Allergic/Immunologic: Negative.   Neurological: Negative.   Hematological: Negative.   Psychiatric/Behavioral: Negative.       Objective:    Physical Exam  Constitutional: She is oriented to person, place, and time. She appears well-developed and well-nourished.  HENT:  Head: Normocephalic and atraumatic.  Cardiovascular: Normal rate, regular rhythm, normal heart sounds and intact distal pulses. Exam reveals no gallop and no friction rub.  No murmur heard. Pulmonary/Chest: Effort normal and breath sounds normal. No respiratory distress. She has no wheezes. She has no rales. She exhibits no tenderness.  Abdominal: Soft. Bowel sounds are normal.  Musculoskeletal: Normal range of motion.  Neurological: She is alert and oriented to person, place, and time.  Skin: Skin is warm and dry.  Psychiatric: She has a normal mood and affect. Her behavior is normal. Judgment and thought content normal.  Nursing note and vitals reviewed.   BP 116/72   Pulse 68   Temp 98 F (36.7 C) (Oral)   Resp 18   Ht 5\' 9"  (1.753 m)   Wt 200 lb (90.7 kg)   SpO2 96%   BMI 29.53 kg/m  Wt Readings from Last 3 Encounters:  09/28/18 200 lb (90.7 kg)  04/17/18 207 lb (93.9 kg)  12/05/17 205 lb 12.8 oz (93.4 kg)     Health Maintenance Due  Topic Date Due  . HIV Screening  05/09/1976  . COLONOSCOPY  09/14/2017    There are no preventive care reminders to display for this patient.  No results found for: TSH Lab Results  Component Value Date   WBC 6.2 12/05/2017   HGB 15.4 12/05/2017   HCT 44.6 12/05/2017   MCV 95 12/05/2017   PLT 226 12/05/2017   Lab Results  Component Value  Date   NA 141 12/05/2017   K 4.1 12/05/2017   CO2 22 12/05/2017   GLUCOSE 84 12/05/2017   BUN 15 12/05/2017   CREATININE 0.84 12/05/2017   BILITOT 0.3 12/05/2017   ALKPHOS 56 12/05/2017   AST 13 12/05/2017   ALT 11 12/05/2017   PROT 6.6 12/05/2017   ALBUMIN 4.3 12/05/2017   CALCIUM 9.2 12/05/2017   No results found for: CHOL No results found for: HDL No results found for: LDLCALC No  results found for: TRIG No results found for: CHOLHDL No results found for: HGBA1C    Assessment & Plan:   Problem List Items Addressed This Visit      Digestive   Tubulovillous adenoma of colon   Relevant Orders   Ambulatory referral to Gastroenterology     Other   Encounter for long-term current use of high risk medication   Relevant Orders   CBC with Differential/Platelet   Comprehensive metabolic panel   Estradiol   TestT+TestF+SHBG   Hormone replacement therapy (HRT)   Relevant Orders   CBC with Differential/Platelet   Comprehensive metabolic panel   Estradiol   TestT+TestF+SHBG   Female-to-female transgender person   Relevant Medications   estradiol (ESTRACE) 2 MG tablet   Other Relevant Orders   CBC with Differential/Platelet   Comprehensive metabolic panel   Estradiol   TestT+TestF+SHBG   Hyperlipidemia LDL goal <130   Relevant Medications   pravastatin (PRAVACHOL) 80 MG tablet    Other Visit Diagnoses    Encounter to establish care    -  Primary   Lipid screening       Relevant Orders   Lipid Panel   Screening for endocrine disorder       Relevant Orders   Hemoglobin A1c   Moderate episode of recurrent major depressive disorder ([facility])       Relevant Medications   escitalopram (LEXAPRO) 20 MG tablet   buPROPion (WELLBUTRIN SR) 150 MG 12 hr tablet      Meds ordered this encounter  Medications  . pravastatin (PRAVACHOL) 80 MG tablet    Sig: Take 1 tablet (80 mg total) by mouth daily.    Dispense:  90 tablet    Refill:  0    Needs appointment for additional  refills.    Order Specific Question:   Supervising Provider    Answer:   Forrest Moron [id number]  . estradiol (ESTRACE) 2 MG tablet    Sig: Take 1 tablet (2 mg total) by mouth daily.    Dispense:  90 tablet    Refill:  3    Order Specific Question:   Supervising Provider    Answer:   Delia Chimes A [id number]  . escitalopram (LEXAPRO) 20 MG tablet    Sig: Take 1 tablet (20 mg total) by mouth daily.    Dispense:  30 tablet    Refill:  0    Ov needed for refills    Order Specific Question:   Supervising Provider    Answer:   Delia Chimes A [id number]  . buPROPion (WELLBUTRIN SR) 150 MG 12 hr tablet    Sig: TAKE 1 TABLET(150 MG) BY MOUTH TWICE DAILY    Dispense:  60 tablet    Refill:  0    Patient has to make an appointment for future refills    Order Specific Question:   Supervising Provider    Answer:   Forrest Moron [[id number]]    Follow-up: Return in about 6 mos for labs and med check.  PLAN:  Refills given for all four meds - 6 mo supply, return for labs at that time.  Pt appears generally healthy and is in good spirits today.   Discussed importance of following with GI for colon cancer screening - smoking and EtOH intake increase risk for GI cancer.  Patient encouraged to call clinic with any questions, comments, or concerns.   Thank you for your visit with Primary Care at [facility] today.  Darin Arndt  Orland Mustard, NP [facility] Care at [facility] [address] [place], [place] [address] [phone]

## 2018-09-29 ENCOUNTER — Encounter: Payer: Self-pay | Admitting: Gastroenterology

## 2018-10-02 LAB — COMPREHENSIVE METABOLIC PANEL
ALT: 11 IU/L (ref 0–32)
AST: 15 IU/L (ref 0–40)
Albumin/Globulin Ratio: 1.9 (ref 1.2–2.2)
Albumin: 4.1 g/dL (ref 3.8–4.9)
Alkaline Phosphatase: 67 IU/L (ref 39–117)
BUN/Creatinine Ratio: 12 (ref 9–23)
BUN: 12 mg/dL (ref 6–24)
Bilirubin Total: <0.2 mg/dL (ref 0.0–1.2)
CO2: 23 mmol/L (ref 20–29)
Calcium: 9.4 mg/dL (ref 8.7–10.2)
Chloride: 106 mmol/L (ref 96–106)
Creatinine, Ser: 0.97 mg/dL (ref 0.57–1.00)
GFR calc Af Amer: 75 mL/min/{1.73_m2} (ref >59)
GFR calc non Af Amer: 65 mL/min/{1.73_m2} (ref >59)
Globulin, Total: 2.2 g/dL (ref 1.5–4.5)
Glucose: 88 mg/dL (ref 65–99)
Potassium: 4.4 mmol/L (ref 3.5–5.2)
Sodium: 143 mmol/L (ref 134–144)
Total Protein: 6.3 g/dL (ref 6.0–8.5)

## 2018-10-02 LAB — CBC WITH DIFFERENTIAL/PLATELET
Basophils Absolute: 0 10*3/uL (ref 0.0–0.2)
Basos: 1 %
EOS (ABSOLUTE): 0.2 10*3/uL (ref 0.0–0.4)
Eos: 3 %
Hematocrit: 43.6 % (ref 34.0–46.6)
Hemoglobin: 14.9 g/dL (ref 11.1–15.9)
Immature Grans (Abs): 0 10*3/uL (ref 0.0–0.1)
Immature Granulocytes: 0 %
Lymphocytes Absolute: 1.4 10*3/uL (ref 0.7–3.1)
Lymphs: 23 %
MCH: 31.8 pg (ref 26.6–33.0)
MCHC: 34.2 g/dL (ref 31.5–35.7)
MCV: 93 fL (ref 79–97)
Monocytes Absolute: 0.5 10*3/uL (ref 0.1–0.9)
Monocytes: 9 %
Neutrophils Absolute: 3.8 10*3/uL (ref 1.4–7.0)
Neutrophils: 64 %
Platelets: 269 10*3/uL (ref 150–450)
RBC: 4.68 x10E6/uL (ref 3.77–5.28)
RDW: 12.4 % (ref 11.7–15.4)
WBC: 5.9 10*3/uL (ref 3.4–10.8)

## 2018-10-02 LAB — LIPID PANEL
Chol/HDL Ratio: 2.7 ratio (ref 0.0–4.4)
Cholesterol, Total: 186 mg/dL (ref 100–199)
HDL: 70 mg/dL (ref >39)
LDL Calculated: 99 mg/dL (ref 0–99)
Triglycerides: 84 mg/dL (ref 0–149)
VLDL Cholesterol Cal: 17 mg/dL (ref 5–40)

## 2018-10-02 LAB — TESTT+TESTF+SHBG
Sex Hormone Binding: 72.8 nmol/L (ref 17.3–125.0)
Testosterone, Free: 3.1 pg/mL (ref 0.0–4.2)
Testosterone, Total, LC/MS: 27.2 ng/dL

## 2018-10-02 LAB — ESTRADIOL: Estradiol: 51.6 pg/mL

## 2018-10-02 LAB — HEMOGLOBIN A1C
Est. average glucose Bld gHb Est-mCnc: 108 mg/dL
Hgb A1c MFr Bld: 5.4 % (ref 4.8–5.6)

## 2018-10-23 ENCOUNTER — Other Ambulatory Visit: Payer: Self-pay | Admitting: Registered Nurse

## 2018-10-23 DIAGNOSIS — F331 Major depressive disorder, recurrent, moderate: Secondary | ICD-10-CM

## 2018-10-26 NOTE — Telephone Encounter (Signed)
Patient is requesting a refill of the following medications: Requested Prescriptions   Pending Prescriptions Disp Refills  . buPROPion (WELLBUTRIN SR) 150 MG 12 hr tablet [Pharmacy Med Name: BUPROPION SR 150MG  TABLETS (12 H)] 60 tablet 0    Sig: TAKE 1 TABLET(150 MG) BY MOUTH TWICE DAILY  . buPROPion (WELLBUTRIN SR) 150 MG 12 hr tablet [Pharmacy Med Name: BUPROPION SR 150MG  TABLETS (12 H)] 60 tablet 0    Sig: TAKE 1 TABLET(150 MG) BY MOUTH TWICE DAILY    Date of patient request: 10/23/2018 Last office visit: 09/28/2018 Date of last refill: 09/28/2018 Last refill amount: 60 tab Follow up time period per chart: Return in about 6 mos for labs and med check ( Not yet scheduled)

## 2018-11-03 ENCOUNTER — Other Ambulatory Visit: Payer: Self-pay

## 2018-11-03 ENCOUNTER — Ambulatory Visit (AMBULATORY_SURGERY_CENTER): Payer: Self-pay

## 2018-11-03 VITALS — Ht 70.0 in | Wt 200.0 lb

## 2018-11-03 DIAGNOSIS — Z86010 Personal history of colonic polyps: Secondary | ICD-10-CM

## 2018-11-03 MED ORDER — NA SULFATE-K SULFATE-MG SULF 17.5-3.13-1.6 GM/177ML PO SOLN: 1.0000 | Freq: Once | ORAL | 0 refills | Status: AC

## 2018-11-03 NOTE — Progress Notes (Signed)
Denies allergies to eggs or soy products. Denies complication of anesthesia or sedation. Denies use of weight loss medication. Denies use of O2.   Emmi instructions given for colonoscopy.  Pre-Visit was conducted by phone due to Covid 19. Instructions were reviewed and mailed to patients confirmed home address. A 15.00 coupon for Suprep was given to the patient. Patient was encouraged to call if she had any questions regarding instructions.

## 2018-11-09 ENCOUNTER — Encounter: Payer: Self-pay | Admitting: Acute Care

## 2018-11-09 ENCOUNTER — Ambulatory Visit
Admission: RE | Admit: 2018-11-09 | Discharge: 2018-11-09 | Disposition: A | Discharge status: Home / Self Care | Payer: BLUE CROSS/BLUE SHIELD | Source: Ambulatory Visit | Attending: Acute Care | Admitting: Acute Care

## 2018-11-09 ENCOUNTER — Ambulatory Visit (INDEPENDENT_AMBULATORY_CARE_PROVIDER_SITE_OTHER): Payer: BLUE CROSS/BLUE SHIELD | Admitting: Acute Care

## 2018-11-09 ENCOUNTER — Other Ambulatory Visit: Payer: Self-pay

## 2018-11-09 VITALS — BP 118/62 | HR 82 | Temp 98.3°F | Ht 70.0 in | Wt 201.2 lb

## 2018-11-09 DIAGNOSIS — F17210 Nicotine dependence, cigarettes, uncomplicated: Secondary | ICD-10-CM

## 2018-11-09 DIAGNOSIS — Z122 Encounter for screening for malignant neoplasm of respiratory organs: Secondary | ICD-10-CM

## 2018-11-09 NOTE — Progress Notes (Signed)
Shared Decision Making Visit Lung Cancer Screening Program [phone])   Eligibility:  Age 58 y.o.  Pack Years Smoking History Calculation 51 pack year smoking history (# packs/per year x # years smoked)  Recent History of coughing up blood  no  Unexplained weight loss? no ( >Than 15 pounds within the last 6 months )  Prior History Lung / other cancer no (Diagnosis within the last 5 years already requiring surveillance chest CT Scans).  Smoking Status Current Smoker  Former Smokers: Years since quit: NA  Quit Date: NA  Visit Components:  Discussion included one or more decision making aids. yes  Discussion included risk/benefits of screening. yes  Discussion included potential follow up diagnostic testing for abnormal scans. yes  Discussion included meaning and risk of over diagnosis. yes  Discussion included meaning and risk of False Positives. yes  Discussion included meaning of total radiation exposure. yes  Counseling Included:  Importance of adherence to annual lung cancer LDCT screening. yes  Impact of comorbidities on ability to participate in the program. yes  Ability and willingness to under diagnostic treatment. yes  Smoking Cessation Counseling:  Current Smokers:   Discussed importance of smoking cessation. yes  Information about tobacco cessation classes and interventions provided to patient. yes  Patient provided with "ticket" for LDCT Scan. yes  Symptomatic Patient. No  Counseling  Diagnosis Code: Tobacco Use Z72.0  Asymptomatic Patient yes  Counseling (Intermediate counseling: > three minutes counseling) Y6063  Former Smokers:   Discussed the importance of maintaining cigarette abstinence. yes  Diagnosis Code: Personal History of Nicotine Dependence. K16.010  Information about tobacco cessation classes and interventions provided to patient. Yes  Patient provided with "ticket" for LDCT Scan. yes  Written Order for Lung Cancer  Screening with LDCT placed in Epic. Yes (CT Chest Lung Cancer Screening Low Dose W/O CM) [id number] Z12.2-Screening of respiratory organs Z87.891-Personal history of nicotine dependence  BP 118/62 (BP Location: Left Arm)   Pulse 82   Temp 98.3 F (36.8 C) (Oral)   Ht 5\' 10"  (1.778 m)   Wt 201 lb 3.2 oz (91.3 kg)   BMI 28.87 kg/m    I have spent 25 minutes of face to face time with Ms. [name] discussing the risks and benefits of lung cancer screening. We viewed a power point together that explained in detail the above noted topics. We paused at intervals to allow for questions to be asked and answered to ensure understanding.We discussed that the single most powerful action that she can take to decrease her risk of developing lung cancer is to quit smoking. We discussed whether or not she is ready to commit to setting a quit date. We discussed options for tools to aid in quitting smoking including nicotine replacement therapy, non-nicotine medications, support groups, Quit Smart classes, and behavior modification. We discussed that often times setting smaller, more achievable goals, such as eliminating 1 cigarette a day for a week and then 2 cigarettes a day for a week can be helpful in slowly decreasing the number of cigarettes smoked. This allows for a sense of accomplishment as well as providing a clinical benefit. I gave her the " Be Stronger Than Your Excuses" card with contact information for community resources, classes, free nicotine replacement therapy, and access to mobile apps, text messaging, and on-line smoking cessation help. I have also given her my card and contact information in the event she needs to contact me. We discussed the time and location of the scan, and  that either Doroteo Glassman RN or I will call with the results within 24-48 hours of receiving them. I have offered her  a copy of the power point we viewed  as a resource in the event they need reinforcement of the concepts we  discussed today in the office. The patient verbalized understanding of all of  the above and had no further questions upon leaving the office. They have my contact information in the event they have any further questions.  I spent 3 minutes counseling on smoking cessation and the health risks of continued tobacco abuse.  I explained to the patient that there has been a high incidence of coronary artery disease noted on these exams. I explained that this is a non-gated exam therefore degree or severity cannot be determined. This patient is currently on statin therapy. I have asked the patient to follow-up with their PCP regarding any incidental findings of coronary artery disease and management with diet or medication as their PCP  feels is clinically indicated. The patient verbalized understanding of the above and had no further questions upon completion of the visit.      Magdalen Spatz, NP 11/09/2018 2:57 PM

## 2018-11-10 ENCOUNTER — Telehealth: Payer: Self-pay | Admitting: Gastroenterology

## 2018-11-10 NOTE — Telephone Encounter (Signed)
No to all answers. °

## 2018-11-10 NOTE — Telephone Encounter (Signed)

## 2018-11-11 ENCOUNTER — Ambulatory Visit (AMBULATORY_SURGERY_CENTER): Payer: BLUE CROSS/BLUE SHIELD | Admitting: Gastroenterology

## 2018-11-11 ENCOUNTER — Other Ambulatory Visit: Payer: Self-pay

## 2018-11-11 ENCOUNTER — Encounter: Payer: Self-pay | Admitting: Gastroenterology

## 2018-11-11 VITALS — BP 118/64 | HR 67 | Temp 97.5°F | Resp 17 | Ht 70.0 in | Wt 200.0 lb

## 2018-11-11 DIAGNOSIS — Z86010 Personal history of colonic polyps: Secondary | ICD-10-CM

## 2018-11-11 DIAGNOSIS — D123 Benign neoplasm of transverse colon: Secondary | ICD-10-CM | POA: Diagnosis not present

## 2018-11-11 DIAGNOSIS — D125 Benign neoplasm of sigmoid colon: Secondary | ICD-10-CM | POA: Diagnosis not present

## 2018-11-11 DIAGNOSIS — K635 Polyp of colon: Secondary | ICD-10-CM

## 2018-11-11 MED ORDER — SODIUM CHLORIDE 0.9 % IV SOLN: 500.0000 mL | Freq: Once | INTRAVENOUS | Status: DC

## 2018-11-11 NOTE — Patient Instructions (Signed)
4 polyps removed today Diverticulosis and internal hemorrhoids noted Please read handouts  YOU HAD AN ENDOSCOPIC PROCEDURE TODAY AT [facility]:   Refer to the procedure report that was given to you for any specific questions about what was found during the examination.  If the procedure report does not answer your questions, please call your gastroenterologist to clarify.  If you requested that your care partner not be given the details of your procedure findings, then the procedure report has been included in a sealed envelope for you to review at your convenience later.  YOU SHOULD EXPECT: Some feelings of bloating in the abdomen. Passage of more gas than usual.  Walking can help get rid of the air that was put into your GI tract during the procedure and reduce the bloating. If you had a lower endoscopy (such as a colonoscopy or flexible sigmoidoscopy) you may notice spotting of blood in your stool or on the toilet paper. If you underwent a bowel prep for your procedure, you may not have a normal bowel movement for a few days.  Please Note:  You might notice some irritation and congestion in your nose or some drainage.  This is from the oxygen used during your procedure.  There is no need for concern and it should clear up in a day or so.  SYMPTOMS TO REPORT IMMEDIATELY:   Following lower endoscopy (colonoscopy or flexible sigmoidoscopy):  Excessive amounts of blood in the stool  Significant tenderness or worsening of abdominal pains  Swelling of the abdomen that is new, acute  Fever of 100F or higher   For urgent or emergent issues, a gastroenterologist can be reached at any hour by calling [phone].   DIET:  We do recommend a small meal at first, but then you may proceed to your regular diet.  Drink plenty of fluids but you should avoid alcoholic beverages for 24 hours.  ACTIVITY:  You should plan to take it easy for the rest of today and you should NOT DRIVE or  use heavy machinery until tomorrow (because of the sedation medicines used during the test).    FOLLOW UP: Our staff will call the number listed on your records 48-72 hours following your procedure to check on you and address any questions or concerns that you may have regarding the information given to you following your procedure. If we do not reach you, we will leave a message.  We will attempt to reach you two times.  During this call, we will ask if you have developed any symptoms of COVID 19. If you develop any symptoms (ie: fever, flu-like symptoms, shortness of breath, cough etc.) before then, please call [phone].  If you test positive for Covid 19 in the 2 weeks post procedure, please call and report this information to [place].    If any biopsies were taken you will be contacted by phone or by letter within the next 1-3 weeks.  Please call us at [phone] if you have not heard about the biopsies in 3 weeks.    SIGNATURES/CONFIDENTIALITY: You and/or your care partner have signed paperwork which will be entered into your electronic medical record.  These signatures attest to the fact that that the information above on your After Visit Summary has been reviewed and is understood.  Full responsibility of the confidentiality of this discharge information lies with you and/or your care-partner.

## 2018-11-11 NOTE — Progress Notes (Signed)
PT taken to PACU. Monitors in place. VSS. Report given to RN. 

## 2018-11-11 NOTE — Progress Notes (Signed)
Courtney Washington took temp and Judy Branson took vitals. 

## 2018-11-11 NOTE — Progress Notes (Signed)
Pt's states no medical or surgical changes since previsit or office visit. 

## 2018-11-11 NOTE — Progress Notes (Signed)
Called to room to assist during endoscopic procedure.  Patient ID and intended procedure confirmed with present staff. Received instructions for my participation in the procedure from the performing physician.  

## 2018-11-11 NOTE — Op Note (Signed)
[facility] Patient Name: Jaclyn Rowe Procedure Date: 11/11/2018 9:06 AM MRN: [id number] Endoscopist: Remo Lipps P. Havery Moros , MD Age: 58 Referring MD:  Date of Birth: Jan 11, 1961 Gender: Female Account #: [id number] Procedure:                Colonoscopy Indications:              High risk colon cancer surveillance: Personal                            history of colonic polyps - history of remote                            surgical resection of advanced adenoma, last                            colonoscopy 4-5 years ago per patient Medicines:                Monitored Anesthesia Care Procedure:                Pre-Anesthesia Assessment:                           - Prior to the procedure, a History and Physical                            was performed, and patient medications and                            allergies were reviewed. The patient's tolerance of                            previous anesthesia was also reviewed. The risks                            and benefits of the procedure and the sedation                            options and risks were discussed with the patient.                            All questions were answered, and informed consent                            was obtained. Prior Anticoagulants: The patient has                            taken no previous anticoagulant or antiplatelet                            agents. ASA Grade Assessment: II - A patient with                            mild systemic disease. After reviewing the risks  and benefits, the patient was deemed in                            satisfactory condition to undergo the procedure.                           After obtaining informed consent, the colonoscope                            was passed under direct vision. Throughout the                            procedure, the patient's blood pressure, pulse, and                            oxygen saturations were  monitored continuously. The                            Colonoscope was introduced through the anus and                            advanced to the the ileocolonic anastomosis. The                            colonoscopy was performed without difficulty. The                            patient tolerated the procedure well. The quality                            of the bowel preparation was good. The ileocecal                            valve, appendiceal orifice, and rectum were                            photographed. Scope In: 9:12:52 AM Scope Out: 9:26:21 AM Scope Withdrawal Time: 0 hours 10 minutes 54 seconds  Total Procedure Duration: 0 hours 13 minutes 29 seconds  Findings:                 The perianal and digital rectal examinations were                            normal.                           There was evidence of a prior end-to-side                            ileo-colonic anastomosis in the proximal transverse                            colon or ascending colon. This was patent and was  characterized by healthy appearing mucosa.                           The terminal ileum appeared normal.                           A 3 mm polyp was found in the transverse colon. The                            polyp was sessile. The polyp was removed with a                            cold snare. Resection and retrieval were complete.                           Four sessile polyps were found in the sigmoid                            colon. The polyps were 3 mm in size. These polyps                            were removed with a cold snare. Resection and                            retrieval were complete.                           Multiple small-mouthed diverticula were found in                            the sigmoid colon.                           Internal hemorrhoids were found during retroflexion.                           The exam was otherwise without  abnormality. Complications:            No immediate complications. Estimated blood loss:                            Minimal. Estimated Blood Loss:     Estimated blood loss was minimal. Impression:               - Patent end-to-side ileo-colonic anastomosis,                            characterized by healthy appearing mucosa.                           - The examined portion of the ileum was normal.                           - One 3 mm polyp in the transverse colon, removed  with a cold snare. Resected and retrieved.                           - Four 3 mm polyps in the sigmoid colon, removed                            with a cold snare. Resected and retrieved.                           - Diverticulosis in the sigmoid colon.                           - Internal hemorrhoids.                           - The examination was otherwise normal. Recommendation:           - Patient has a contact number available for                            emergencies. The signs and symptoms of potential                            delayed complications were discussed with the                            patient. Return to normal activities tomorrow.                            Written discharge instructions were provided to the                            patient.                           - Resume previous diet.                           - Continue present medications.                           - Await pathology results. Remo Lipps P. Armbruster, MD 11/11/2018 9:31:21 AM This report has been signed electronically.

## 2018-11-12 ENCOUNTER — Other Ambulatory Visit: Payer: Self-pay | Admitting: *Deleted

## 2018-11-12 DIAGNOSIS — F17210 Nicotine dependence, cigarettes, uncomplicated: Secondary | ICD-10-CM

## 2018-11-12 DIAGNOSIS — Z122 Encounter for screening for malignant neoplasm of respiratory organs: Secondary | ICD-10-CM

## 2018-11-13 ENCOUNTER — Telehealth: Payer: Self-pay

## 2018-11-13 NOTE — Telephone Encounter (Signed)
  Follow up Call-  Call back number 11/11/2018  Post procedure Call Back phone  # [phone]  Permission to leave phone message Yes  Some recent data might be hidden     Patient questions:  Do you have a fever, pain , or abdominal swelling? No. Pain Score  0 *  Have you tolerated food without any problems? Yes.    Have you been able to return to your normal activities? Yes.    Do you have any questions about your discharge instructions: Diet   No. Medications  No. Follow up visit  No.  Do you have questions or concerns about your Care? No.  Actions: * If pain score is 4 or above: No action needed, pain <4.    1. Have you developed a fever since your procedure?no  2.   Have you had an respiratory symptoms (SOB or cough) since your procedure?no 3.   Have you tested positive for COVID 19 since your procedure no  4.   Have you had any family members/close contacts diagnosed with the COVID 19 since your procedure?  no   If yes to any of these questions please route to Joylene John, RN and Alphonsa Gin, Therapist, sports.

## 2018-11-16 ENCOUNTER — Other Ambulatory Visit: Payer: Self-pay | Admitting: Registered Nurse

## 2018-11-16 DIAGNOSIS — F331 Major depressive disorder, recurrent, moderate: Secondary | ICD-10-CM

## 2018-11-18 NOTE — Telephone Encounter (Signed)
Requested medication (s) are due for refill today: Yes  Requested medication (s) are on the active medication list: Yes  Last refill:  09/28/18  Future visit scheduled:No  Notes to clinic:  Unable to leave pt. A message to schedule an OV.    Requested Prescriptions  Pending Prescriptions Disp Refills   escitalopram (LEXAPRO) 20 MG tablet [Pharmacy Med Name: ESCITALOPRAM 20MG  TABLETS] 30 tablet 0    Sig: TAKE 1 TABLET(20 MG) BY MOUTH DAILY     Psychiatry:  Antidepressants - SSRI Failed - 11/16/2018  5:30 PM      Failed - Completed PHQ-2 or PHQ-9 in the last 360 days.      Passed - Valid encounter within last 6 months    Recent Outpatient Visits          1 month ago Encounter to establish care   [facility], [place], NP   7 months ago Moderate episode of recurrent major depressive disorder [facility])   [facility] at [facility], Laurey Arrow, MD   11 months ago Cigarette nicotine dependence with other nicotine-induced disorder   [facility] at [facility], Laurey Arrow, MD              escitalopram (LEXAPRO) 20 MG tablet [Pharmacy Med Name: ESCITALOPRAM 20MG  TABLETS] 30 tablet 0    Sig: TAKE 1 TABLET(20 MG) BY MOUTH DAILY     Psychiatry:  Antidepressants - SSRI Failed - 11/16/2018  5:30 PM      Failed - Completed PHQ-2 or PHQ-9 in the last 360 days.      Passed - Valid encounter within last 6 months    Recent Outpatient Visits          1 month ago Encounter to establish care   Primary Care at [place], NP   7 months ago Moderate episode of recurrent major depressive disorder [facility])   [facility] at [facility], Laurey Arrow, MD   11 months ago Cigarette nicotine dependence with other nicotine-induced disorder   [facility] at [facility], Laurey Arrow, MD

## 2018-11-24 ENCOUNTER — Encounter: Payer: Self-pay | Admitting: Registered Nurse

## 2018-11-25 ENCOUNTER — Other Ambulatory Visit: Payer: Self-pay | Admitting: Registered Nurse

## 2018-11-25 DIAGNOSIS — D367 Benign neoplasm of other specified sites: Secondary | ICD-10-CM

## 2018-11-25 NOTE — Progress Notes (Signed)
Pt having ongoing issues with sizeable cyst on arm - noted on recent CPE. Now requesting referral to Derm. Provided here, messaged patient to let them know.   Kathrin Ruddy, NP

## 2018-12-21 ENCOUNTER — Other Ambulatory Visit: Payer: Self-pay | Admitting: Registered Nurse

## 2018-12-21 DIAGNOSIS — E785 Hyperlipidemia, unspecified: Secondary | ICD-10-CM

## 2018-12-21 DIAGNOSIS — F331 Major depressive disorder, recurrent, moderate: Secondary | ICD-10-CM

## 2019-03-08 ENCOUNTER — Other Ambulatory Visit: Payer: Self-pay | Admitting: Registered Nurse

## 2019-03-08 DIAGNOSIS — F331 Major depressive disorder, recurrent, moderate: Secondary | ICD-10-CM

## 2019-05-22 ENCOUNTER — Other Ambulatory Visit: Payer: Self-pay | Admitting: Registered Nurse

## 2019-05-22 DIAGNOSIS — F331 Major depressive disorder, recurrent, moderate: Secondary | ICD-10-CM

## 2019-05-24 MED ORDER — BUPROPION HCL ER (SR) 150 MG PO TB12: ORAL_TABLET | ORAL | 1 refills | Status: DC

## 2019-05-24 NOTE — Telephone Encounter (Signed)
  Patient is requesting a refill of the following medications: Requested Prescriptions   Pending Prescriptions Disp Refills  . buPROPion (WELLBUTRIN SR) 150 MG 12 hr tablet 60 tablet 0    Date of patient request:05/22/2019 Last office visit: 09/28/2018 Date of last refill: 12/21/2018 Last refill amount: 60 tablets Follow up time period per chart: no follow up

## 2019-06-06 ENCOUNTER — Ambulatory Visit: Payer: BLUE CROSS/BLUE SHIELD | Attending: Internal Medicine

## 2019-06-06 DIAGNOSIS — Z23 Encounter for immunization: Secondary | ICD-10-CM | POA: Insufficient documentation

## 2019-06-06 NOTE — Progress Notes (Signed)
   Covid-19 Vaccination Clinic  Name:  Jaclyn Rowe    MRN: [id number] DOB: [birth year]  06/06/2019  [name] was observed post Covid-19 immunization for 15 minutes without incidence. She was provided with Vaccine Information Sheet and instruction to access the V-Safe system.   [name] was instructed to call 911 with any severe reactions post vaccine: Marland Kitchen Difficulty breathing  . Swelling of your face and throat  . A fast heartbeat  . A bad rash all over your body  . Dizziness and weakness    Immunizations Administered    Name Date Dose VIS Date Route   Pfizer COVID-19 Vaccine 06/06/2019 11:41 AM 0.3 mL 04/09/2019 Intramuscular   Manufacturer: [place]   Lot: [id number]   [place]: [id number]

## 2019-06-30 ENCOUNTER — Other Ambulatory Visit: Payer: Self-pay | Admitting: Registered Nurse

## 2019-06-30 DIAGNOSIS — E785 Hyperlipidemia, unspecified: Secondary | ICD-10-CM

## 2019-07-02 NOTE — Telephone Encounter (Signed)
Attempted to call pt no answer. LMTCB  Please schedule pt for f.u appt for med refils. Will send in curtsy refill.   Last seen with lab 6 m ago on 09/28/18

## 2019-07-02 NOTE — Telephone Encounter (Signed)
Pt alerted via MyChart as well to schedule appt

## 2019-07-03 ENCOUNTER — Ambulatory Visit: Payer: BLUE CROSS/BLUE SHIELD | Attending: Internal Medicine

## 2019-07-03 DIAGNOSIS — Z23 Encounter for immunization: Secondary | ICD-10-CM | POA: Insufficient documentation

## 2019-07-03 NOTE — Progress Notes (Signed)
   Covid-19 Vaccination Clinic  Name:  Jaclyn Rowe    MRN: [id number] DOB: [birth year]  07/03/2019  Jaclyn Rowe was observed post Covid-19 immunization for 15 minutes without incident. She was provided with Vaccine Information Sheet and instruction to access the V-Safe system.   Jaclyn Rowe was instructed to call 911 with any severe reactions post vaccine: Marland Kitchen Difficulty breathing  . Swelling of face and throat  . A fast heartbeat  . A bad rash all over body  . Dizziness and weakness   Immunizations Administered    Name Date Dose VIS Date Route   Pfizer COVID-19 Vaccine 07/03/2019  9:59 AM 0.3 mL 04/09/2019 Intramuscular   Manufacturer: [place]   Lot: [id number]   [place]: [id number]

## 2019-07-05 ENCOUNTER — Other Ambulatory Visit: Payer: Self-pay | Admitting: Registered Nurse

## 2019-07-05 ENCOUNTER — Encounter: Payer: Self-pay | Admitting: Registered Nurse

## 2019-07-05 ENCOUNTER — Ambulatory Visit (INDEPENDENT_AMBULATORY_CARE_PROVIDER_SITE_OTHER): Payer: BLUE CROSS/BLUE SHIELD | Admitting: Registered Nurse

## 2019-07-05 ENCOUNTER — Other Ambulatory Visit: Payer: Self-pay

## 2019-07-05 VITALS — BP 99/63 | HR 80 | Temp 98.7°F | Resp 16 | Ht 70.0 in | Wt 207.2 lb

## 2019-07-05 DIAGNOSIS — Z125 Encounter for screening for malignant neoplasm of prostate: Secondary | ICD-10-CM

## 2019-07-05 DIAGNOSIS — Z130 Encounter for screening for diseases of the blood and blood-forming organs and certain disorders involving the immune mechanism: Secondary | ICD-10-CM | POA: Diagnosis not present

## 2019-07-05 DIAGNOSIS — Z1329 Encounter for screening for other suspected endocrine disorder: Secondary | ICD-10-CM

## 2019-07-05 DIAGNOSIS — F331 Major depressive disorder, recurrent, moderate: Secondary | ICD-10-CM

## 2019-07-05 DIAGNOSIS — Z13228 Encounter for screening for other metabolic disorders: Secondary | ICD-10-CM | POA: Diagnosis not present

## 2019-07-05 DIAGNOSIS — E785 Hyperlipidemia, unspecified: Secondary | ICD-10-CM | POA: Diagnosis not present

## 2019-07-05 MED ORDER — PRAVASTATIN SODIUM 80 MG PO TABS: ORAL_TABLET | ORAL | 3 refills | Status: DC

## 2019-07-05 MED ORDER — PRAVASTATIN SODIUM 80 MG PO TABS: ORAL_TABLET | ORAL | 0 refills | Status: DC

## 2019-07-05 NOTE — Patient Instructions (Signed)
° ° ° °  If you have lab work done today you will be contacted with your lab results within the next 2 weeks.  If you have not heard from us then please contact us. The fastest way to get your results is to register for My Chart. ° ° °IF you received an x-ray today, you will receive an invoice from [facility]. Please contact [facility] at [phone] with questions or concerns regarding your invoice.  ° °IF you received labwork today, you will receive an invoice from [facility]. Please contact [facility] at [phone] with questions or concerns regarding your invoice.  ° °Our billing staff will not be able to assist you with questions regarding bills from these companies. ° °You will be contacted with the lab results as soon as they are available. The fastest way to get your results is to activate your My Chart account. Instructions are located on the last page of this paperwork. If you have not heard from us regarding the results in 2 weeks, please contact this office. °  ° ° ° °

## 2019-07-06 ENCOUNTER — Encounter: Payer: Self-pay | Admitting: Registered Nurse

## 2019-07-06 LAB — CBC WITH DIFFERENTIAL
Basophils Absolute: 0 10*3/uL (ref 0.0–0.2)
Basos: 1 %
EOS (ABSOLUTE): 0.2 10*3/uL (ref 0.0–0.4)
Eos: 4 %
Hematocrit: 42.2 % (ref 34.0–46.6)
Hemoglobin: 14.9 g/dL (ref 11.1–15.9)
Immature Grans (Abs): 0 10*3/uL (ref 0.0–0.1)
Immature Granulocytes: 0 %
Lymphocytes Absolute: 1.4 10*3/uL (ref 0.7–3.1)
Lymphs: 24 %
MCH: 32.7 pg (ref 26.6–33.0)
MCHC: 35.3 g/dL (ref 31.5–35.7)
MCV: 93 fL (ref 79–97)
Monocytes Absolute: 0.7 10*3/uL (ref 0.1–0.9)
Monocytes: 12 %
Neutrophils Absolute: 3.4 10*3/uL (ref 1.4–7.0)
Neutrophils: 59 %
RBC: 4.55 x10E6/uL (ref 3.77–5.28)
RDW: 12.4 % (ref 11.7–15.4)
WBC: 5.8 10*3/uL (ref 3.4–10.8)

## 2019-07-06 LAB — COMPREHENSIVE METABOLIC PANEL
ALT: 12 IU/L (ref 0–32)
AST: 16 IU/L (ref 0–40)
Albumin/Globulin Ratio: 1.9 (ref 1.2–2.2)
Albumin: 4.2 g/dL (ref 3.8–4.9)
Alkaline Phosphatase: 66 IU/L (ref 39–117)
BUN/Creatinine Ratio: 14 (ref 9–23)
BUN: 13 mg/dL (ref 6–24)
Bilirubin Total: <0.2 mg/dL (ref 0.0–1.2)
CO2: 23 mmol/L (ref 20–29)
Calcium: 9 mg/dL (ref 8.7–10.2)
Chloride: 105 mmol/L (ref 96–106)
Creatinine, Ser: 0.94 mg/dL (ref 0.57–1.00)
GFR calc Af Amer: 77 mL/min/{1.73_m2} (ref >59)
GFR calc non Af Amer: 67 mL/min/{1.73_m2} (ref >59)
Globulin, Total: 2.2 g/dL (ref 1.5–4.5)
Glucose: 85 mg/dL (ref 65–99)
Potassium: 4.2 mmol/L (ref 3.5–5.2)
Sodium: 139 mmol/L (ref 134–144)
Total Protein: 6.4 g/dL (ref 6.0–8.5)

## 2019-07-06 LAB — LIPID PANEL
Chol/HDL Ratio: 2.8 ratio (ref 0.0–4.4)
Cholesterol, Total: 205 mg/dL — ABNORMAL HIGH (ref 100–199)
HDL: 74 mg/dL (ref >39)
LDL Chol Calc (NIH): 116 mg/dL — ABNORMAL HIGH (ref 0–99)
Triglycerides: 83 mg/dL (ref 0–149)
VLDL Cholesterol Cal: 15 mg/dL (ref 5–40)

## 2019-07-06 LAB — HEMOGLOBIN A1C
Est. average glucose Bld gHb Est-mCnc: 111 mg/dL
Hgb A1c MFr Bld: 5.5 % (ref 4.8–5.6)

## 2019-07-06 LAB — PSA: Prostate Specific Ag, Serum: 0.1 ng/mL

## 2019-07-06 LAB — TSH: TSH: 2.71 u[IU]/mL (ref 0.450–4.500)

## 2019-07-06 NOTE — Telephone Encounter (Signed)
Please Advise

## 2019-07-18 ENCOUNTER — Encounter: Payer: Self-pay | Admitting: Registered Nurse

## 2019-07-18 NOTE — Progress Notes (Signed)
Established Patient Office Visit  Subjective:  Patient ID: Jaclyn Rowe, adult    DOB: 07-25-1960  Age: 59 y.o. MRN: [id number]  CC:  Chief Complaint  Patient presents with  . Medication Refill    Pravastatin    HPI Jaclyn Rowe presents for medication refill - pravastatin  Feeling well overall. Has tolerated pravastatin well No complaints or further concerns  Was able to get first shot of COVID vaccine - second vaccine scheduled.  Past Medical History:  Diagnosis Date  . Anxiety   . Depression   . Hyperlipidemia   . Polyp, colonic     Past Surgical History:  Procedure Laterality Date  . BREAST ENHANCEMENT SURGERY Bilateral   . COLON SURGERY    . COSMETIC SURGERY    . LAPAROSCOPIC COLON RESECTION    . ORCHIECTOMY Bilateral 2013  . VAGINOPLASTY  2013    Family History  Problem Relation Age of Onset  . Colon cancer Neg Hx   . Esophageal cancer Neg Hx   . Rectal cancer Neg Hx   . Stomach cancer Neg Hx     Social History   Socioeconomic History  . Marital status: Married    Spouse name: Not on file  . Number of children: 2  . Years of education: Not on file  . Highest education level: Not on file  Occupational History  . Not on file  Tobacco Use  . Smoking status: Current Every Day Smoker    Packs/day: 1.50    Years: 38.00    Pack years: 57.00  . Smokeless tobacco: Never Used  Substance and Sexual Activity  . Alcohol use: Yes    Alcohol/week: 15.0 standard drinks    Types: 15 Standard drinks or equivalent per week  . Drug use: Not Currently  . Sexual activity: Not on file  Other Topics Concern  . Not on file  Social History Narrative   Patient is transgender.   Social Determinants of Health   Financial Resource Strain: Low Risk   . Difficulty of Paying Living Expenses: Not hard at all  Food Insecurity: No Food Insecurity  . Worried About Charity fundraiser in the Last Year: Never true  . Ran Out of Food in the Last  Year: Never true  Transportation Needs: No Transportation Needs  . Lack of Transportation (Medical): No  . Lack of Transportation (Non-Medical): No  Physical Activity:   . Days of Exercise per Week:   . Minutes of Exercise per Session:   Stress:   . Feeling of Stress :   Social Connections:   . Frequency of Communication with Friends and Family:   . Frequency of Social Gatherings with Friends and Family:   . Attends Religious Services:   . Active Member of Clubs or Organizations:   . Attends Archivist Meetings:   Marland Kitchen Marital Status:   Intimate Partner Violence:   . Fear of Current or Ex-Partner:   . Emotionally Abused:   Marland Kitchen Physically Abused:   . Sexually Abused:     Outpatient Medications Prior to Visit  Medication Sig Dispense Refill  . buPROPion (WELLBUTRIN SR) 150 MG 12 hr tablet Take 1 tablet daily by mouth 90 tablet 1  . estradiol (ESTRACE) 2 MG tablet Take 1 tablet (2 mg total) by mouth daily. 90 tablet 3  . escitalopram (LEXAPRO) 20 MG tablet TAKE 1 TABLET(20 MG) BY MOUTH DAILY 30 tablet 3  . pravastatin (PRAVACHOL) 80 MG tablet TAKE  1 TABLET(80 MG) BY MOUTH DAILY 90 tablet 0   No facility-administered medications prior to visit.    No Known Allergies  ROS Review of Systems  Constitutional: Negative.   HENT: Negative.   Eyes: Negative.   Respiratory: Negative.   Cardiovascular: Negative.   Gastrointestinal: Negative.   Endocrine: Negative.   Genitourinary: Negative.   Musculoskeletal: Negative.   Skin: Negative.   Allergic/Immunologic: Negative.   Neurological: Negative.   Hematological: Negative.   Psychiatric/Behavioral: Negative.   All other systems reviewed and are negative.     Objective:    Physical Exam  Constitutional: She is oriented to person, place, and time. She appears well-developed and well-nourished. No distress.  Cardiovascular: Normal rate and regular rhythm.  Pulmonary/Chest: Effort normal. No respiratory distress.    Neurological: She is alert and oriented to person, place, and time.  Skin: She is not diaphoretic.  Psychiatric: She has a normal mood and affect. Her behavior is normal. Judgment and thought content normal.  Nursing note and vitals reviewed.   BP 99/63   Pulse 80   Temp 98.7 F (37.1 C) (Oral)   Resp 16   Ht 5\' 10"  (1.778 m)   Wt 207 lb 3.2 oz (94 kg)   SpO2 96%   BMI 29.73 kg/m  Wt Readings from Last 3 Encounters:  07/05/19 207 lb 3.2 oz (94 kg)  11/11/18 200 lb (90.7 kg)  11/09/18 201 lb 3.2 oz (91.3 kg)     Health Maintenance Due  Topic Date Due  . HIV Screening  Never done    There are no preventive care reminders to display for this patient.  Lab Results  Component Value Date   TSH 2.710 07/05/2019   Lab Results  Component Value Date   WBC 5.8 07/05/2019   HGB 14.9 07/05/2019   HCT 42.2 07/05/2019   MCV 93 07/05/2019   PLT 269 09/28/2018   Lab Results  Component Value Date   NA 139 07/05/2019   K 4.2 07/05/2019   CO2 23 07/05/2019   GLUCOSE 85 07/05/2019   BUN 13 07/05/2019   CREATININE 0.94 07/05/2019   BILITOT <0.2 07/05/2019   ALKPHOS 66 07/05/2019   AST 16 07/05/2019   ALT 12 07/05/2019   PROT 6.4 07/05/2019   ALBUMIN 4.2 07/05/2019   CALCIUM 9.0 07/05/2019   Lab Results  Component Value Date   CHOL 205 (H) 07/05/2019   Lab Results  Component Value Date   HDL 74 07/05/2019   Lab Results  Component Value Date   LDLCALC 116 (H) 07/05/2019   Lab Results  Component Value Date   TRIG 83 07/05/2019   Lab Results  Component Value Date   CHOLHDL 2.8 07/05/2019   Lab Results  Component Value Date   HGBA1C 5.5 07/05/2019      Assessment & Plan:   Problem List Items Addressed This Visit      Other   Hyperlipidemia LDL goal <130   Relevant Medications   pravastatin (PRAVACHOL) 80 MG tablet   Other Relevant Orders   Lipid panel (Completed)    Other Visit Diagnoses    Screening for endocrine disorder    -  Primary    Relevant Orders   Comprehensive metabolic panel (Completed)   Screening for endocrine, metabolic and immunity disorder       Relevant Orders   CBC With Differential (Completed)   TSH (Completed)   Hemoglobin A1c (Completed)   Screening PSA (prostate specific antigen)  Relevant Orders   PSA (Completed)      Meds ordered this encounter  Medications  . DISCONTD: pravastatin (PRAVACHOL) 80 MG tablet    Sig: TAKE 1 TABLET(80 MG) BY MOUTH DAILY    Dispense:  90 tablet    Refill:  0  . pravastatin (PRAVACHOL) 80 MG tablet    Sig: TAKE 1 TABLET(80 MG) BY MOUTH DAILY    Dispense:  90 tablet    Refill:  3    Order Specific Question:   Supervising Provider    Answer:   Forrest Moron [id number]    Follow-up: No follow-ups on file.   PLAN  Pravastatin refilled x 1 year - return sooner with any issues or if lipid panel shows elevation  Otherwise no concerns  Patient encouraged to call clinic with any questions, comments, or concerns.  Maximiano Coss, NP

## 2019-08-20 ENCOUNTER — Other Ambulatory Visit: Payer: Self-pay | Admitting: Registered Nurse

## 2019-08-20 DIAGNOSIS — F331 Major depressive disorder, recurrent, moderate: Secondary | ICD-10-CM

## 2019-08-20 NOTE — Telephone Encounter (Signed)
Patient is requesting a refill of the following medications: Requested Prescriptions   Pending Prescriptions Disp Refills   buPROPion (WELLBUTRIN SR) 150 MG 12 hr tablet [Pharmacy Med Name: BUPROPION SR 150MG  TABLETS (12 H)] 180 tablet     Sig: TAKE 1 TABLET BY MOUTH TWICE DAILY    Date of patient request: 08/20/2019 Last office visit: 07/05/2019 Date of last refill: 05/24/2019 Last refill amount: 90 tab

## 2019-09-21 ENCOUNTER — Other Ambulatory Visit: Payer: Self-pay | Admitting: Registered Nurse

## 2019-09-21 DIAGNOSIS — Z789 Other specified health status: Secondary | ICD-10-CM

## 2019-10-02 ENCOUNTER — Other Ambulatory Visit: Payer: Self-pay | Admitting: Registered Nurse

## 2019-10-02 DIAGNOSIS — E785 Hyperlipidemia, unspecified: Secondary | ICD-10-CM

## 2019-10-28 ENCOUNTER — Other Ambulatory Visit: Payer: Self-pay | Admitting: Registered Nurse

## 2019-10-28 DIAGNOSIS — F331 Major depressive disorder, recurrent, moderate: Secondary | ICD-10-CM

## 2019-11-12 ENCOUNTER — Ambulatory Visit (HOSPITAL_BASED_OUTPATIENT_CLINIC_OR_DEPARTMENT_OTHER)
Admission: RE | Admit: 2019-11-12 | Discharge: 2019-11-12 | Disposition: A | Discharge status: Home / Self Care | Payer: BLUE CROSS/BLUE SHIELD | Source: Ambulatory Visit | Attending: Acute Care | Admitting: Acute Care

## 2019-11-12 ENCOUNTER — Other Ambulatory Visit: Payer: Self-pay

## 2019-11-12 DIAGNOSIS — F17200 Nicotine dependence, unspecified, uncomplicated: Secondary | ICD-10-CM | POA: Insufficient documentation

## 2019-11-12 DIAGNOSIS — Z122 Encounter for screening for malignant neoplasm of respiratory organs: Secondary | ICD-10-CM | POA: Diagnosis present

## 2019-11-12 DIAGNOSIS — F17210 Nicotine dependence, cigarettes, uncomplicated: Secondary | ICD-10-CM

## 2019-11-15 ENCOUNTER — Encounter: Payer: Self-pay | Admitting: Registered Nurse

## 2019-11-16 NOTE — Progress Notes (Signed)
Please call patient and let them  know their  low dose Ct was read as a Lung RADS 1, negative study: no nodules or definitely benign nodules. Radiology recommendation is for a repeat LDCT in 12 months. .Please let them  know we will order and schedule their  annual screening scan for 10/2020. Please let them  know there was notation of CAD on their  scan.  Please remind the patient  that this is a non-gated exam therefore degree or severity of disease  cannot be determined. Please have them  follow up with their PCP regarding potential risk factor modification, dietary therapy or pharmacologic therapy if clinically indicated. Pt.  is  currently on statin therapy. Please place order for annual  screening scan for  10/2020 and fax results to PCP. Thanks so much.

## 2019-11-22 ENCOUNTER — Other Ambulatory Visit: Payer: Self-pay | Admitting: *Deleted

## 2019-11-22 DIAGNOSIS — F17210 Nicotine dependence, cigarettes, uncomplicated: Secondary | ICD-10-CM

## 2019-12-01 ENCOUNTER — Encounter: Payer: Self-pay | Admitting: Registered Nurse

## 2019-12-09 ENCOUNTER — Other Ambulatory Visit: Payer: Self-pay | Admitting: Registered Nurse

## 2019-12-09 DIAGNOSIS — K644 Residual hemorrhoidal skin tags: Secondary | ICD-10-CM | POA: Insufficient documentation

## 2019-12-09 MED ORDER — LIDOCAINE-HYDROCORT (PERIANAL) 3-0.5 % EX CREA: 1.0000 "application " | TOPICAL_CREAM | Freq: Two times a day (BID) | CUTANEOUS | 2 refills | Status: DC

## 2019-12-09 NOTE — Telephone Encounter (Signed)
Pt has decided to try the topical cream for now to treat the hemorrhoids.

## 2020-02-05 ENCOUNTER — Other Ambulatory Visit: Payer: Self-pay | Admitting: Registered Nurse

## 2020-02-05 DIAGNOSIS — F331 Major depressive disorder, recurrent, moderate: Secondary | ICD-10-CM

## 2020-02-05 NOTE — Telephone Encounter (Signed)
Requested Prescriptions  Pending Prescriptions Disp Refills  . escitalopram (LEXAPRO) 20 MG tablet [Pharmacy Med Name: ESCITALOPRAM 20MG  TABLETS] 30 tablet 3    Sig: TAKE 1 TABLET(20 MG) BY MOUTH DAILY     Psychiatry:  Antidepressants - SSRI Failed - 02/05/2020  8:02 AM      Failed - Valid encounter within last 6 months    Recent Outpatient Visits          7 months ago Screening for endocrine disorder   [facility], Delfino Lovett, NP   1 year ago Encounter to establish care   [facility], NP   1 year ago Moderate episode of recurrent major depressive disorder [facility])   [facility], Laurey Arrow, MD   2 years ago Cigarette nicotine dependence with other nicotine-induced disorder   [facility], Laurey Arrow, MD

## 2020-02-18 ENCOUNTER — Encounter: Payer: Self-pay | Admitting: Registered Nurse

## 2020-02-22 ENCOUNTER — Other Ambulatory Visit: Payer: Self-pay | Admitting: Registered Nurse

## 2020-02-22 DIAGNOSIS — F331 Major depressive disorder, recurrent, moderate: Secondary | ICD-10-CM

## 2020-03-08 ENCOUNTER — Encounter: Payer: Self-pay | Admitting: *Deleted

## 2020-03-22 ENCOUNTER — Other Ambulatory Visit: Payer: Self-pay | Admitting: Registered Nurse

## 2020-03-22 DIAGNOSIS — F331 Major depressive disorder, recurrent, moderate: Secondary | ICD-10-CM

## 2020-03-22 MED ORDER — ESCITALOPRAM OXALATE 20 MG PO TABS: 20.0000 mg | ORAL_TABLET | Freq: Every day | ORAL | 3 refills | Status: DC

## 2020-03-22 NOTE — Telephone Encounter (Signed)
Patient is requesting a refill of the following medications: Requested Prescriptions   Pending Prescriptions Disp Refills  . escitalopram (LEXAPRO) 20 MG tablet 30 tablet 0    Date of patient request: 03/22/2020 Last office visit: 07/05/2019 Date of last refill: 02/05/2020 Last refill amount:30 tablets Follow up time period per chart: None  Last refill was a courtesy patient is making a appointment today

## 2020-03-29 ENCOUNTER — Ambulatory Visit (INDEPENDENT_AMBULATORY_CARE_PROVIDER_SITE_OTHER): Payer: BC Managed Care – PPO | Admitting: Registered Nurse

## 2020-03-29 ENCOUNTER — Encounter: Payer: Self-pay | Admitting: Registered Nurse

## 2020-03-29 ENCOUNTER — Other Ambulatory Visit: Payer: Self-pay

## 2020-03-29 VITALS — BP 121/71 | HR 67 | Temp 97.9°F | Resp 18 | Ht 70.0 in | Wt 211.0 lb

## 2020-03-29 DIAGNOSIS — F17218 Nicotine dependence, cigarettes, with other nicotine-induced disorders: Secondary | ICD-10-CM

## 2020-03-29 DIAGNOSIS — Z1231 Encounter for screening mammogram for malignant neoplasm of breast: Secondary | ICD-10-CM | POA: Diagnosis not present

## 2020-03-29 DIAGNOSIS — E785 Hyperlipidemia, unspecified: Secondary | ICD-10-CM | POA: Diagnosis not present

## 2020-03-29 DIAGNOSIS — F419 Anxiety disorder, unspecified: Secondary | ICD-10-CM | POA: Diagnosis not present

## 2020-07-03 ENCOUNTER — Encounter: Payer: Self-pay | Admitting: Registered Nurse

## 2020-07-03 NOTE — Progress Notes (Signed)
Established Patient Office Visit  Subjective:  Patient ID: Jaclyn Rowe, adult    DOB: 1960-10-27  Age: 60 y.o. MRN: [id number]  CC:  Chief Complaint  Patient presents with  . Medication Refill    Patient states she is here for an medication refill on Lexapro. Patient has no other questions or concerns. GAD7=3 .     HPI Jaclyn Rowe presents for medication refill  No acute complaints or concerns. Has been on current regimen for some time, still achieving therapeutic effect. Coping well with changes through pandemic, though does have concerns for parents who are older adults.   Otherwise, no concerns at this time.  Past Medical History:  Diagnosis Date  . Anxiety   . Depression   . Hyperlipidemia   . Polyp, colonic     Past Surgical History:  Procedure Laterality Date  . BREAST ENHANCEMENT SURGERY Bilateral   . COLON SURGERY    . COSMETIC SURGERY    . LAPAROSCOPIC COLON RESECTION    . ORCHIECTOMY Bilateral 2013  . VAGINOPLASTY  2013    Family History  Problem Relation Age of Onset  . Colon cancer Neg Hx   . Esophageal cancer Neg Hx   . Rectal cancer Neg Hx   . Stomach cancer Neg Hx     Social History   Socioeconomic History  . Marital status: Married    Spouse name: Not on file  . Number of children: 2  . Years of education: Not on file  . Highest education level: Not on file  Occupational History  . Not on file  Tobacco Use  . Smoking status: Current Every Day Smoker    Packs/day: 1.50    Years: 38.00    Pack years: 57.00  . Smokeless tobacco: Never Used  Vaping Use  . Vaping Use: Never used  Substance and Sexual Activity  . Alcohol use: Yes    Alcohol/week: 15.0 standard drinks    Types: 15 Standard drinks or equivalent per week  . Drug use: Not Currently  . Sexual activity: Not on file  Other Topics Concern  . Not on file  Social History Narrative   Patient is transgender.   Social Determinants of Health   Financial  Resource Strain: Not on file  Food Insecurity: Not on file  Transportation Needs: Not on file  Physical Activity: Not on file  Stress: Not on file  Social Connections: Not on file  Intimate Partner Violence: Not on file    Outpatient Medications Prior to Visit  Medication Sig Dispense Refill  . escitalopram (LEXAPRO) 20 MG tablet Take 1 tablet (20 mg total) by mouth daily. 90 tablet 3  . estradiol (ESTRACE) 2 MG tablet TAKE 1 TABLET(2 MG) BY MOUTH DAILY 90 tablet 3  . Lidocaine-Hydrocort, Perianal, 3-0.5 % CREA Apply 1 application topically in the morning and at bedtime. 85 g 2  . buPROPion (WELLBUTRIN SR) 150 MG 12 hr tablet TAKE 1 TABLET BY MOUTH TWICE DAILY 180 tablet 3  . pravastatin (PRAVACHOL) 80 MG tablet TAKE 1 TABLET(80 MG) BY MOUTH DAILY (Patient not taking: Reported on 03/29/2020) 90 tablet 3   No facility-administered medications prior to visit.    No Known Allergies  ROS Review of Systems  Constitutional: Negative.   HENT: Negative.   Eyes: Negative.   Respiratory: Negative.   Cardiovascular: Negative.   Gastrointestinal: Negative.   Genitourinary: Negative.   Musculoskeletal: Negative.   Skin: Negative.   Neurological: Negative.  Psychiatric/Behavioral: Negative.   All other systems reviewed and are negative.     Objective:    Physical Exam Constitutional:      General: She is not in acute distress.    Appearance: Normal appearance. She is normal weight. She is not ill-appearing, toxic-appearing or diaphoretic.  Cardiovascular:     Rate and Rhythm: Normal rate and regular rhythm.     Heart sounds: Normal heart sounds. No murmur heard. No friction rub. No gallop.   Pulmonary:     Effort: Pulmonary effort is normal. No respiratory distress.     Breath sounds: Normal breath sounds. No stridor. No wheezing, rhonchi or rales.  Chest:     Chest wall: No tenderness.  Neurological:     General: No focal deficit present.     Mental Status: She is alert and  oriented to person, place, and time. Mental status is at baseline.  Psychiatric:        Mood and Affect: Mood normal.        Behavior: Behavior normal.        Thought Content: Thought content normal.        Judgment: Judgment normal.     BP 121/71   Pulse 67   Temp 97.9 F (36.6 C) (Temporal)   Resp 18   Ht 5\' 10"  (1.778 m)   Wt 211 lb (95.7 kg)   SpO2 95%   BMI 30.28 kg/m  Wt Readings from Last 3 Encounters:  03/29/20 211 lb (95.7 kg)  07/05/19 207 lb 3.2 oz (94 kg)  11/11/18 200 lb (90.7 kg)     Health Maintenance Due  Topic Date Due  . MAMMOGRAM  08/05/2019  . COVID-19 Vaccine (3 - Booster for Pfizer series) 01/03/2020    There are no preventive care reminders to display for this patient.  Lab Results  Component Value Date   TSH 2.710 07/05/2019   Lab Results  Component Value Date   WBC 5.8 07/05/2019   HGB 14.9 07/05/2019   HCT 42.2 07/05/2019   MCV 93 07/05/2019   PLT 269 09/28/2018   Lab Results  Component Value Date   NA 139 07/05/2019   K 4.2 07/05/2019   CO2 23 07/05/2019   GLUCOSE 85 07/05/2019   BUN 13 07/05/2019   CREATININE 0.94 07/05/2019   BILITOT <0.2 07/05/2019   ALKPHOS 66 07/05/2019   AST 16 07/05/2019   ALT 12 07/05/2019   PROT 6.4 07/05/2019   ALBUMIN 4.2 07/05/2019   CALCIUM 9.0 07/05/2019   Lab Results  Component Value Date   CHOL 205 (H) 07/05/2019   Lab Results  Component Value Date   HDL 74 07/05/2019   Lab Results  Component Value Date   LDLCALC 116 (H) 07/05/2019   Lab Results  Component Value Date   TRIG 83 07/05/2019   Lab Results  Component Value Date   CHOLHDL 2.8 07/05/2019   Lab Results  Component Value Date   HGBA1C 5.5 07/05/2019      Assessment & Plan:   Problem List Items Addressed This Visit      Other   Nicotine dependence   Anxiety   Hyperlipidemia LDL goal <130    Other Visit Diagnoses    Screening mammogram for breast cancer    -  Primary   Relevant Orders   MM Digital  Diagnostic Bilat      No orders of the defined types were placed in this encounter.   Follow-up: No follow-ups  on file.   PLAN  Refill meds x 6 mo  Suggest CPE at next visit  Labs at next visit  Discussed healthy lifestyle  Patient encouraged to call clinic with any questions, comments, or concerns.   Maximiano Coss, NP

## 2020-07-23 ENCOUNTER — Other Ambulatory Visit: Payer: Self-pay | Admitting: Registered Nurse

## 2020-07-23 DIAGNOSIS — E785 Hyperlipidemia, unspecified: Secondary | ICD-10-CM

## 2020-08-21 ENCOUNTER — Other Ambulatory Visit: Payer: Self-pay | Admitting: Registered Nurse

## 2020-08-21 DIAGNOSIS — F331 Major depressive disorder, recurrent, moderate: Secondary | ICD-10-CM

## 2020-09-16 ENCOUNTER — Other Ambulatory Visit: Payer: Self-pay | Admitting: Registered Nurse

## 2020-09-16 DIAGNOSIS — Z789 Other specified health status: Secondary | ICD-10-CM

## 2020-10-27 ENCOUNTER — Other Ambulatory Visit: Payer: Self-pay | Admitting: Registered Nurse

## 2020-10-27 DIAGNOSIS — Z789 Other specified health status: Secondary | ICD-10-CM

## 2020-10-29 ENCOUNTER — Other Ambulatory Visit: Payer: Self-pay | Admitting: Registered Nurse

## 2020-10-29 DIAGNOSIS — E785 Hyperlipidemia, unspecified: Secondary | ICD-10-CM

## 2020-10-31 ENCOUNTER — Other Ambulatory Visit: Payer: Self-pay | Admitting: Registered Nurse

## 2020-10-31 ENCOUNTER — Encounter: Payer: Self-pay | Admitting: Registered Nurse

## 2020-10-31 DIAGNOSIS — F17200 Nicotine dependence, unspecified, uncomplicated: Secondary | ICD-10-CM

## 2020-10-31 NOTE — Progress Notes (Signed)
Annual lung CA screen for current smoker with >30 pack year hx  Kathrin Ruddy, NP

## 2020-11-01 ENCOUNTER — Telehealth: Payer: Self-pay | Admitting: Acute Care

## 2020-11-01 NOTE — Telephone Encounter (Signed)
I have spoke with Mrs. Jaclyn Rowe and her LCS CT has been scheduled for 11/13/2020 @ 7:30am at [facility]

## 2020-11-07 MED ORDER — ESTRADIOL 2 MG PO TABS: 2.0000 mg | ORAL_TABLET | Freq: Every day | ORAL | 0 refills | Status: DC

## 2020-11-10 ENCOUNTER — Other Ambulatory Visit: Payer: Self-pay

## 2020-11-10 ENCOUNTER — Encounter: Payer: Self-pay | Admitting: Registered Nurse

## 2020-11-10 ENCOUNTER — Ambulatory Visit (INDEPENDENT_AMBULATORY_CARE_PROVIDER_SITE_OTHER): Payer: BC Managed Care – PPO | Admitting: Registered Nurse

## 2020-11-10 VITALS — BP 103/71 | HR 77 | Temp 98.1°F | Resp 18 | Ht 70.0 in | Wt 208.4 lb

## 2020-11-10 DIAGNOSIS — M25511 Pain in right shoulder: Secondary | ICD-10-CM

## 2020-11-10 DIAGNOSIS — F419 Anxiety disorder, unspecified: Secondary | ICD-10-CM

## 2020-11-10 MED ORDER — METHOCARBAMOL 500 MG PO TABS: 500.0000 mg | ORAL_TABLET | Freq: Three times a day (TID) | ORAL | 0 refills | Status: DC | PRN

## 2020-11-10 MED ORDER — PREDNISONE 10 MG (21) PO TBPK: ORAL_TABLET | ORAL | 0 refills | Status: DC

## 2020-11-10 MED ORDER — ARIPIPRAZOLE 2 MG PO TABS: 2.0000 mg | ORAL_TABLET | Freq: Every day | ORAL | 0 refills | Status: DC

## 2020-11-10 NOTE — Patient Instructions (Addendum)
Jaclyn Rowe -  Always great to see you  In brief:  Start abilify 2mg  daily. I don't expect side effects. This can be a useful adjunct to lexapro. Ok to take in the morning, it shouldn't be sedative. Sending a short prednisone taper for shoulder pain to relieve inflammation. Follow package instructions. Sending methocarbamol for muscle relaxer to use up to three times daily as needed. May be mildly sedative, but I think you'll tolerate it well. Lastly, sending tramadol for breakthrough pain. Again, possibly sedative. Tylenol recommended for OTC Referral to Dr. Justice Britain for shoulder pain Med check on abilify in 4-6 weeks or so. Can be in person or virtual  Let me know if you need anything,  Rich    If you have lab work done today you will be contacted with your lab results within the next 2 weeks.  If you have not heard from [place] then please contact us. The fastest way to get your results is to register for My Chart.   IF you received an x-ray today, you will receive an invoice from [facility]. Please contact [facility] at [phone] with questions or concerns regarding your invoice.   IF you received labwork today, you will receive an invoice from [facility]. Please contact [facility] at [phone] with questions or concerns regarding your invoice.   Our billing staff will not be able to assist you with questions regarding bills from these companies.  You will be contacted with the lab results as soon as they are available. The fastest way to get your results is to activate your My Chart account. Instructions are located on the last page of this paperwork. If you have not heard from [place] regarding the results in 2 weeks, please contact this office.

## 2020-11-10 NOTE — Progress Notes (Signed)
Established Patient Office Visit  Subjective:  Patient ID: Jaclyn Rowe, adult    DOB: [birth year]  Age: 60 y.o. MRN: [id number]  CC:  Chief Complaint  Patient presents with   Shoulder Pain    Patient states she has been experiencing some riht shoulder pain for about 6 weeks that is now getting worse. Patient states she tried topically stuff with her arm. And didn't seem to help. PHQ9=10    HPI Tya Haughey presents for shoulder pain and med management  Shoulder pain R side Onset in past few weeks Slow onset Now worsening Limited ROM in all aspects, some weakness No radiculopathy Has tried tylenol with limited relief. No injury or trauma noted No hx of injury to this shoulder to her knowledge.  Med management Continues escitalopram Has stopped wellbutrin citing no effect Interested in something to help with her down moods Not doing things she's interested in like riding her motorcycle and playing guitar. Has been noticed by her partner as well Denies hi/si  Past Medical History:  Diagnosis Date   Anxiety    Depression    Hyperlipidemia    Polyp, colonic     Past Surgical History:  Procedure Laterality Date   BREAST ENHANCEMENT SURGERY Bilateral    COLON SURGERY     COSMETIC SURGERY     LAPAROSCOPIC COLON RESECTION     ORCHIECTOMY Bilateral 2013   VAGINOPLASTY  2013    Family History  Problem Relation Age of Onset   Colon cancer Neg Hx    Esophageal cancer Neg Hx    Rectal cancer Neg Hx    Stomach cancer Neg Hx     Social History   Socioeconomic History   Marital status: Married    Spouse name: Not on file   Number of children: 2   Years of education: Not on file   Highest education level: Not on file  Occupational History   Not on file  Tobacco Use   Smoking status: Every Day    Packs/day: 1.50    Years: 38.00    Pack years: 57.00    Types: Cigarettes   Smokeless tobacco: Never  Vaping Use   Vaping Use: Never used   Substance and Sexual Activity   Alcohol use: Yes    Alcohol/week: 15.0 standard drinks    Types: 15 Standard drinks or equivalent per week   Drug use: Not Currently   Sexual activity: Not on file  Other Topics Concern   Not on file  Social History Narrative   Patient is transgender.   Social Determinants of Health   Financial Resource Strain: Not on file  Food Insecurity: Not on file  Transportation Needs: Not on file  Physical Activity: Not on file  Stress: Not on file  Social Connections: Not on file  Intimate Partner Violence: Not on file    Outpatient Medications Prior to Visit  Medication Sig Dispense Refill   escitalopram (LEXAPRO) 20 MG tablet Take 1 tablet (20 mg total) by mouth daily. 90 tablet 3   estradiol (ESTRACE) 2 MG tablet Take 1 tablet (2 mg total) by mouth daily. 90 tablet 0   Lidocaine-Hydrocort, Perianal, 3-0.5 % CREA Apply 1 application topically in the morning and at bedtime. 85 g 2   pravastatin (PRAVACHOL) 80 MG tablet TAKE 1 TABLET(80 MG) BY MOUTH DAILY 90 tablet 3   buPROPion (WELLBUTRIN SR) 150 MG 12 hr tablet TAKE 1 TABLET BY MOUTH TWICE DAILY (Patient not taking: Reported  on 11/10/2020) 180 tablet 3   No facility-administered medications prior to visit.    No Known Allergies  ROS Review of Systems  Constitutional: Negative.   HENT: Negative.    Eyes: Negative.   Respiratory: Negative.    Cardiovascular: Negative.   Gastrointestinal: Negative.   Endocrine: Negative.   Genitourinary: Negative.   Musculoskeletal:  Positive for arthralgias. Negative for back pain, gait problem, joint swelling, myalgias, neck pain and neck stiffness.  Skin: Negative.   Allergic/Immunologic: Negative.   Neurological: Negative.   Hematological: Negative.   Psychiatric/Behavioral:  Positive for dysphoric mood. Negative for agitation, behavioral problems, confusion, decreased concentration, hallucinations, self-injury, sleep disturbance and suicidal ideas. The  patient is not nervous/anxious and is not hyperactive.   All other systems reviewed and are negative.    Objective:    Physical Exam Constitutional:      General: She is not in acute distress.    Appearance: Normal appearance. She is normal weight. She is not ill-appearing, toxic-appearing or diaphoretic.  Cardiovascular:     Rate and Rhythm: Normal rate and regular rhythm.     Heart sounds: Normal heart sounds. No murmur heard.   No friction rub. No gallop.  Pulmonary:     Effort: Pulmonary effort is normal. No respiratory distress.     Breath sounds: Normal breath sounds. No stridor. No wheezing, rhonchi or rales.  Chest:     Chest wall: No tenderness.  Musculoskeletal:        General: Tenderness present. No swelling, deformity or signs of injury.     Right lower leg: No edema.     Left lower leg: No edema.     Comments: Limited ROM in all aspects of R shoulder motion. Mild weakness against external rotation. Pain with internal rotation. Tenderness in joint line and along trapezius.  No tenderness in neck or spine. No tenderness in delt  Neurological:     General: No focal deficit present.     Mental Status: She is alert and oriented to person, place, and time. Mental status is at baseline.  Psychiatric:        Mood and Affect: Mood normal.        Behavior: Behavior normal.        Thought Content: Thought content normal.        Judgment: Judgment normal.    BP 103/71   Pulse 77   Temp 98.1 F (36.7 C) (Temporal)   Resp 18   Ht 5\' 10"  (1.778 m)   Wt 208 lb 6.4 oz (94.5 kg)   SpO2 99%   BMI 29.90 kg/m  Wt Readings from Last 3 Encounters:  11/10/20 208 lb 6.4 oz (94.5 kg)  03/29/20 211 lb (95.7 kg)  07/05/19 207 lb 3.2 oz (94 kg)     Health Maintenance Due  Topic Date Due   MAMMOGRAM  08/05/2019    There are no preventive care reminders to display for this patient.  Lab Results  Component Value Date   TSH 2.710 07/05/2019   Lab Results  Component  Value Date   WBC 5.8 07/05/2019   HGB 14.9 07/05/2019   HCT 42.2 07/05/2019   MCV 93 07/05/2019   PLT 269 09/28/2018   Lab Results  Component Value Date   NA 139 07/05/2019   K 4.2 07/05/2019   CO2 23 07/05/2019   GLUCOSE 85 07/05/2019   BUN 13 07/05/2019   CREATININE 0.94 07/05/2019   BILITOT <0.2 07/05/2019   ALKPHOS  66 07/05/2019   AST 16 07/05/2019   ALT 12 07/05/2019   PROT 6.4 07/05/2019   ALBUMIN 4.2 07/05/2019   CALCIUM 9.0 07/05/2019   Lab Results  Component Value Date   CHOL 205 (H) 07/05/2019   Lab Results  Component Value Date   HDL 74 07/05/2019   Lab Results  Component Value Date   LDLCALC 116 (H) 07/05/2019   Lab Results  Component Value Date   TRIG 83 07/05/2019   Lab Results  Component Value Date   CHOLHDL 2.8 07/05/2019   Lab Results  Component Value Date   HGBA1C 5.5 07/05/2019      Assessment & Plan:   Problem List Items Addressed This Visit       Other   Anxiety - Primary   Relevant Medications   ARIPiprazole (ABILIFY) 2 MG tablet   Other Visit Diagnoses     Acute pain of right shoulder       Relevant Medications   predniSONE (STERAPRED UNI-PAK 21 TAB) 10 MG (21) TBPK tablet   methocarbamol (ROBAXIN) 500 MG tablet   Other Relevant Orders   Ambulatory referral to Orthopedics       Meds ordered this encounter  Medications   ARIPiprazole (ABILIFY) 2 MG tablet    Sig: Take 1 tablet (2 mg total) by mouth daily.    Dispense:  90 tablet    Refill:  0    Order Specific Question:   Supervising Provider    Answer:   Carlota Raspberry, JEFFREY R [[id number]]   predniSONE (STERAPRED UNI-PAK 21 TAB) 10 MG (21) TBPK tablet    Sig: Take per package instructions. Do not skip doses. Finish entire supply.    Dispense:  1 each    Refill:  0    Order Specific Question:   Supervising Provider    Answer:   Carlota Raspberry, JEFFREY R [[id number]]   methocarbamol (ROBAXIN) 500 MG tablet    Sig: Take 1 tablet (500 mg total) by mouth every 8 (eight) hours as needed  for muscle spasms.    Dispense:  60 tablet    Refill:  0    Order Specific Question:   Supervising Provider    Answer:   Carlota Raspberry, JEFFREY R [[id number]]    Follow-up: Return in about 6 weeks (around 12/22/2020) for Med check - abilify.   PLAN Shoulder pain: will give steroid taper and methocarbamol. Will hold NSAIDs for now given her hormone use and smoking hx. Will refer to Dr. Onnie Graham, may benefit from joint inj or further imaging, can also consider physical therapy. Anxiety/Depression: start abilify 2mg  PO qd. Med check in 6 weeks. Reviewed risks, benefits, and alternatives with patient who voices understanding Patient encouraged to call clinic with any questions, comments, or concerns.  Maximiano Coss, NP

## 2020-11-13 ENCOUNTER — Encounter: Payer: Self-pay | Admitting: Registered Nurse

## 2020-11-13 ENCOUNTER — Ambulatory Visit (HOSPITAL_BASED_OUTPATIENT_CLINIC_OR_DEPARTMENT_OTHER)
Admission: RE | Admit: 2020-11-13 | Discharge: 2020-11-13 | Disposition: A | Discharge status: Home / Self Care | Payer: BC Managed Care – PPO | Source: Ambulatory Visit | Attending: Registered Nurse | Admitting: Registered Nurse

## 2020-11-13 DIAGNOSIS — Z122 Encounter for screening for malignant neoplasm of respiratory organs: Secondary | ICD-10-CM | POA: Diagnosis present

## 2020-11-13 DIAGNOSIS — I700 Atherosclerosis of aorta: Secondary | ICD-10-CM | POA: Insufficient documentation

## 2020-11-13 DIAGNOSIS — F17200 Nicotine dependence, unspecified, uncomplicated: Secondary | ICD-10-CM | POA: Diagnosis present

## 2020-11-13 DIAGNOSIS — J449 Chronic obstructive pulmonary disease, unspecified: Secondary | ICD-10-CM | POA: Insufficient documentation

## 2020-11-13 DIAGNOSIS — I2510 Atherosclerotic heart disease of native coronary artery without angina pectoris: Secondary | ICD-10-CM | POA: Insufficient documentation

## 2020-11-20 ENCOUNTER — Telehealth: Payer: Self-pay | Admitting: Acute Care

## 2020-11-20 ENCOUNTER — Encounter: Payer: Self-pay | Admitting: *Deleted

## 2020-11-20 DIAGNOSIS — F17210 Nicotine dependence, cigarettes, uncomplicated: Secondary | ICD-10-CM

## 2020-11-20 NOTE — Telephone Encounter (Signed)
CT result letter sent to pt via Mychart. Copy of CT faxed to PCP. Order placed for 1 yr f/u low dose ct.   

## 2020-11-28 ENCOUNTER — Other Ambulatory Visit: Payer: Self-pay | Admitting: Registered Nurse

## 2020-11-28 DIAGNOSIS — M25511 Pain in right shoulder: Secondary | ICD-10-CM

## 2020-11-28 NOTE — Progress Notes (Signed)
Please call patient and let them  know their  low dose Ct was read as a Lung RADS 1, negative study: no nodules or definitely benign nodules. Radiology recommendation is for a repeat LDCT in 12 months. .Please let them  know we will order and schedule their  annual screening scan for 10/2021. Please let them  know there was notation of CAD on their  scan.  Please remind the patient  that this is a non-gated exam therefore degree or severity of disease  cannot be determined. Please have them  follow up with their PCP regarding potential risk factor modification, dietary therapy or pharmacologic therapy if clinically indicated. Pt.  is  currently on statin therapy. Please place order for annual  screening scan for  10/2021 and fax results to PCP. Thanks so much.  There is aortic atherosclerosis, in addition to left main and 2 vessel coronary artery disease. They are on statin therapy, but have never had an echo. Please have them follow up with PCP. Thanks so much

## 2020-11-28 NOTE — Telephone Encounter (Signed)
LFD 11/10/20 #60 with no refills LOV 11/10/20 NOV 12/15/20

## 2020-12-15 ENCOUNTER — Encounter: Payer: Self-pay | Admitting: Registered Nurse

## 2020-12-15 ENCOUNTER — Other Ambulatory Visit: Payer: Self-pay

## 2020-12-15 ENCOUNTER — Telehealth (INDEPENDENT_AMBULATORY_CARE_PROVIDER_SITE_OTHER): Payer: BC Managed Care – PPO | Admitting: Registered Nurse

## 2020-12-15 DIAGNOSIS — F331 Major depressive disorder, recurrent, moderate: Secondary | ICD-10-CM

## 2020-12-15 DIAGNOSIS — F419 Anxiety disorder, unspecified: Secondary | ICD-10-CM | POA: Diagnosis not present

## 2020-12-15 MED ORDER — ARIPIPRAZOLE 2 MG PO TABS: 2.0000 mg | ORAL_TABLET | Freq: Every day | ORAL | 1 refills | Status: DC

## 2020-12-15 MED ORDER — ESCITALOPRAM OXALATE 20 MG PO TABS: 20.0000 mg | ORAL_TABLET | Freq: Every day | ORAL | 1 refills | Status: DC

## 2020-12-15 NOTE — Progress Notes (Signed)
Telemedicine Encounter- SOAP NOTE Established Patient  This video encounter was conducted with the patient's (or proxy's) verbal consent via audio telecommunications: yes/no: Yes Patient was instructed to have this encounter in a suitably private space; and to only have persons present to whom they give permission to participate. In addition, patient identity was confirmed by use of name plus two identifiers (DOB and address).  I discussed the limitations, risks, security and privacy concerns of performing an evaluation and management service by telephone and the availability of in person appointments. I also discussed with the patient that there may be a patient responsible charge related to this service. The patient expressed understanding and agreed to proceed.  I spent a total of 15 minutes talking with the patient or their proxy.  Patient at home Provider in office  Participants: Jaclyn Ruddy, NP and Tilda Franco  Chief Complaint  Patient presents with   Follow-up    Patient states she is following up on her new medication Ablify. Patient states she has no other concerns.    Subjective   Jaclyn Rowe is a 60 y.o. established patient. Video visit today for med check  HPI Worsening depression at last visit. Had started abilify '2mg'$  PO qd with intent to enhance response to lexapro We had initially tried Wellbutrin and titrated up to '300mg'$  XR PO qd, but no therapeutic effect Noted anhedonia - not enjoying things she usually enjoyed. Denies hi/si  Notes positive effect within 3-4 days Helping maintain positive attitude Getting more enjoyment out of activities she enjoys - motorcycle riding, guitar playing  No AE noted  Patient Active Problem List   Diagnosis Date Noted   Coronary artery disease 11/13/2020   COPD (chronic obstructive pulmonary disease) ([place]) 11/13/2020   Aortic atherosclerosis ([place]) 11/13/2020   External hemorrhoid, bleeding 12/09/2019    Nicotine dependence 02/21/2018   Encounter for long-term current use of high risk medication 02/21/2018   Hormone replacement therapy (HRT) 02/21/2018   Female-to-female transgender person 02/21/2018   Anxiety 11/25/2013   Hyperlipidemia LDL goal <130 11/25/2013   Tubulovillous adenoma of colon 11/25/2013    Past Medical History:  Diagnosis Date   Anxiety    Depression    Hyperlipidemia    Polyp, colonic     Current Outpatient Medications  Medication Sig Dispense Refill   ARIPiprazole (ABILIFY) 2 MG tablet Take 1 tablet (2 mg total) by mouth daily. 90 tablet 0   escitalopram (LEXAPRO) 20 MG tablet Take 1 tablet (20 mg total) by mouth daily. 90 tablet 3   estradiol (ESTRACE) 2 MG tablet Take 1 tablet (2 mg total) by mouth daily. 90 tablet 0   Lidocaine-Hydrocort, Perianal, 3-0.5 % CREA Apply 1 application topically in the morning and at bedtime. 85 g 2   pravastatin (PRAVACHOL) 80 MG tablet TAKE 1 TABLET(80 MG) BY MOUTH DAILY 90 tablet 3   methocarbamol (ROBAXIN) 500 MG tablet TAKE 1 TABLET(500 MG) BY MOUTH EVERY 8 HOURS AS NEEDED FOR MUSCLE SPASMS 60 tablet 0   predniSONE (STERAPRED UNI-PAK 21 TAB) 10 MG (21) TBPK tablet Take per package instructions. Do not skip doses. Finish entire supply. (Patient not taking: Reported on 12/15/2020) 1 each 0   No current facility-administered medications for this visit.    No Known Allergies  Social History   Socioeconomic History   Marital status: Married    Spouse name: Not on file   Number of children: 2   Years of education: Not on file  Highest education level: Not on file  Occupational History   Not on file  Tobacco Use   Smoking status: Every Day    Packs/day: 1.50    Years: 38.00    Pack years: 57.00    Types: Cigarettes   Smokeless tobacco: Never  Vaping Use   Vaping Use: Never used  Substance and Sexual Activity   Alcohol use: Yes    Alcohol/week: 15.0 standard drinks    Types: 15 Standard drinks or equivalent per week    Drug use: Not Currently   Sexual activity: Not on file  Other Topics Concern   Not on file  Social History Narrative   Patient is transgender.   Social Determinants of Health   Financial Resource Strain: Not on file  Food Insecurity: Not on file  Transportation Needs: Not on file  Physical Activity: Not on file  Stress: Not on file  Social Connections: Not on file  Intimate Partner Violence: Not on file    Review of Systems  Constitutional: Negative.   HENT: Negative.    Eyes: Negative.   Respiratory: Negative.    Cardiovascular: Negative.   Gastrointestinal: Negative.   Genitourinary: Negative.   Musculoskeletal: Negative.   Skin: Negative.   Neurological: Negative.   Endo/Heme/Allergies: Negative.   Psychiatric/Behavioral: Negative.    All other systems reviewed and are negative.  Objective   Vitals as reported by the patient: There were no vitals filed for this visit.  There are no diagnoses linked to this encounter.  PLAN Doing well. Continue current regimen. Med check in 6 mo. Patient encouraged to call clinic with any questions, comments, or concerns.  I discussed the assessment and treatment plan with the patient. The patient was provided an opportunity to ask questions and all were answered. The patient agreed with the plan and demonstrated an understanding of the instructions.   The patient was advised to call back or seek an in-person evaluation if the symptoms worsen or if the condition fails to improve as anticipated.  I provided 15 minutes of non-face-to-face time during this encounter.  Maximiano Coss, NP

## 2021-01-31 ENCOUNTER — Other Ambulatory Visit: Payer: Self-pay | Admitting: Registered Nurse

## 2021-01-31 DIAGNOSIS — Z789 Other specified health status: Secondary | ICD-10-CM

## 2021-02-19 NOTE — Telephone Encounter (Signed)
This concern has been previously addressed by myself and/or another provider.  If they patient has ongoing concerns, they can contact me at their convenience.  Thank you,  Rich Shandee Jergens, NP 

## 2021-03-26 ENCOUNTER — Other Ambulatory Visit: Payer: Self-pay | Admitting: Registered Nurse

## 2021-03-26 DIAGNOSIS — F331 Major depressive disorder, recurrent, moderate: Secondary | ICD-10-CM

## 2021-05-08 ENCOUNTER — Other Ambulatory Visit: Payer: Self-pay | Admitting: Registered Nurse

## 2021-05-08 DIAGNOSIS — Z789 Other specified health status: Secondary | ICD-10-CM

## 2021-07-12 ENCOUNTER — Other Ambulatory Visit: Payer: Self-pay | Admitting: Registered Nurse

## 2021-08-09 ENCOUNTER — Other Ambulatory Visit: Payer: Self-pay | Admitting: Registered Nurse

## 2021-08-09 DIAGNOSIS — Z789 Other specified health status: Secondary | ICD-10-CM

## 2021-09-27 ENCOUNTER — Other Ambulatory Visit: Payer: Self-pay | Admitting: Registered Nurse

## 2021-09-27 DIAGNOSIS — F331 Major depressive disorder, recurrent, moderate: Secondary | ICD-10-CM

## 2021-11-09 ENCOUNTER — Other Ambulatory Visit: Payer: Self-pay | Admitting: Registered Nurse

## 2021-11-09 DIAGNOSIS — Z789 Other specified health status: Secondary | ICD-10-CM

## 2021-11-09 NOTE — Telephone Encounter (Signed)
Patient is requesting a refill of the following medications: Requested Prescriptions   Pending Prescriptions Disp Refills   estradiol (ESTRACE) 2 MG tablet [Pharmacy Med Name: ESTRADIOL '2MG'$  TABLETS] 90 tablet 0    Sig: TAKE 1 TABLET(2 MG) BY MOUTH DAILY    Date of patient request: 11/09/2021 Last office visit: 08/09/2021 Date of last refill: 08/09/2021 Last refill amount:90 tablets  Follow up time period per chart:

## 2021-11-13 ENCOUNTER — Ambulatory Visit (HOSPITAL_BASED_OUTPATIENT_CLINIC_OR_DEPARTMENT_OTHER)
Admission: RE | Admit: 2021-11-13 | Discharge: 2021-11-13 | Disposition: A | Discharge status: Home / Self Care | Payer: No Typology Code available for payment source | Source: Ambulatory Visit | Attending: Acute Care | Admitting: Acute Care

## 2021-11-13 DIAGNOSIS — F17210 Nicotine dependence, cigarettes, uncomplicated: Secondary | ICD-10-CM | POA: Insufficient documentation

## 2021-11-13 DIAGNOSIS — J439 Emphysema, unspecified: Secondary | ICD-10-CM | POA: Insufficient documentation

## 2021-11-13 DIAGNOSIS — I700 Atherosclerosis of aorta: Secondary | ICD-10-CM | POA: Diagnosis not present

## 2021-11-13 DIAGNOSIS — Z122 Encounter for screening for malignant neoplasm of respiratory organs: Secondary | ICD-10-CM | POA: Diagnosis not present

## 2021-11-15 ENCOUNTER — Other Ambulatory Visit: Payer: Self-pay

## 2021-11-15 DIAGNOSIS — Z87891 Personal history of nicotine dependence: Secondary | ICD-10-CM

## 2021-11-15 DIAGNOSIS — Z122 Encounter for screening for malignant neoplasm of respiratory organs: Secondary | ICD-10-CM

## 2021-11-15 DIAGNOSIS — F17210 Nicotine dependence, cigarettes, uncomplicated: Secondary | ICD-10-CM

## 2021-11-23 ENCOUNTER — Other Ambulatory Visit: Payer: Self-pay

## 2021-11-23 ENCOUNTER — Telehealth: Payer: Self-pay | Admitting: Registered Nurse

## 2021-11-23 ENCOUNTER — Encounter: Payer: Self-pay | Admitting: Registered Nurse

## 2021-11-23 DIAGNOSIS — E785 Hyperlipidemia, unspecified: Secondary | ICD-10-CM

## 2021-11-23 MED ORDER — PRAVASTATIN SODIUM 80 MG PO TABS: ORAL_TABLET | ORAL | 3 refills | Status: DC

## 2021-11-23 NOTE — Telephone Encounter (Signed)
Encourage patient to contact the pharmacy for refills or they can request refills through [facility]  (Please schedule appointment if patient has not been seen in over a year)    WHAT PHARMACY WOULD THEY LIKE THIS SENT TO: Jaclyn Rowe [phone]  MEDICATION NAME & DOSE: Pravastatin 80 mg   NOTES/COMMENTS FROM PATIENT: Pt is completely out; also sent message in my chart.      [place] office please notify patient: It takes 48-72 hours to process rx refill requests Ask patient to call pharmacy to ensure rx is ready before heading there.

## 2022-01-17 ENCOUNTER — Other Ambulatory Visit: Payer: Self-pay

## 2022-01-17 DIAGNOSIS — F419 Anxiety disorder, unspecified: Secondary | ICD-10-CM

## 2022-01-17 MED ORDER — ARIPIPRAZOLE 2 MG PO TABS: ORAL_TABLET | ORAL | 1 refills | Status: DC

## 2022-02-19 ENCOUNTER — Other Ambulatory Visit: Payer: Self-pay | Admitting: Family Medicine

## 2022-02-19 DIAGNOSIS — Z789 Other specified health status: Secondary | ICD-10-CM

## 2022-02-21 ENCOUNTER — Telehealth: Payer: Self-pay | Admitting: Registered Nurse

## 2022-02-21 NOTE — Telephone Encounter (Signed)
Encourage patient to contact the pharmacy for refills or they can request refills through [facility]  (Please schedule appointment if patient has not been seen in over a year)  Last visit was 12/15/21   WHAT PHARMACY WOULD THEY LIKE THIS SENT TO:  [place], [place] - [address] [facility] [address] NAME & DOSE: estradiol 2 mg   NOTES/COMMENTS FROM PATIENT: This is a Jaclyn Rowe pt. pt hasn't found a new pcp yet. I did gave her [place] number to try est care with their office, being that is the closest location to pt.        [place] office please notify patient: It takes 48-72 hours to process rx refill requests Ask patient to call pharmacy to ensure rx is ready before heading there.

## 2022-02-21 NOTE — Telephone Encounter (Signed)
Called patient and informed her with last visit being in July of 2022 we would not be able to fill this per our policy patient must be seen within 1 year. Did advise patient to request refill at the establish care visit Tuesday next week and they should be able to accommodate

## 2022-02-26 ENCOUNTER — Ambulatory Visit (INDEPENDENT_AMBULATORY_CARE_PROVIDER_SITE_OTHER): Payer: No Typology Code available for payment source | Admitting: Family Medicine

## 2022-02-26 ENCOUNTER — Encounter: Payer: Self-pay | Admitting: Family Medicine

## 2022-02-26 VITALS — BP 120/76 | HR 76 | Temp 97.8°F | Ht 68.5 in | Wt 211.0 lb

## 2022-02-26 DIAGNOSIS — F411 Generalized anxiety disorder: Secondary | ICD-10-CM | POA: Diagnosis not present

## 2022-02-26 DIAGNOSIS — F330 Major depressive disorder, recurrent, mild: Secondary | ICD-10-CM | POA: Diagnosis not present

## 2022-02-26 DIAGNOSIS — F419 Anxiety disorder, unspecified: Secondary | ICD-10-CM

## 2022-02-26 DIAGNOSIS — E782 Mixed hyperlipidemia: Secondary | ICD-10-CM

## 2022-02-26 DIAGNOSIS — F331 Major depressive disorder, recurrent, moderate: Secondary | ICD-10-CM

## 2022-02-26 DIAGNOSIS — Z789 Other specified health status: Secondary | ICD-10-CM | POA: Diagnosis not present

## 2022-02-26 DIAGNOSIS — F17218 Nicotine dependence, cigarettes, with other nicotine-induced disorders: Secondary | ICD-10-CM

## 2022-02-26 MED ORDER — ESTRADIOL 2 MG PO TABS: 2.0000 mg | ORAL_TABLET | Freq: Every day | ORAL | 3 refills | Status: DC

## 2022-02-26 MED ORDER — PRAVASTATIN SODIUM 80 MG PO TABS: ORAL_TABLET | ORAL | 3 refills | Status: DC

## 2022-02-26 MED ORDER — ESCITALOPRAM OXALATE 20 MG PO TABS: ORAL_TABLET | ORAL | 1 refills | Status: DC

## 2022-02-26 MED ORDER — ARIPIPRAZOLE 2 MG PO TABS: ORAL_TABLET | ORAL | 1 refills | Status: DC

## 2022-02-26 NOTE — Patient Instructions (Addendum)
For smoking, call the 1800QUITNOW  For anxiety and depression, we have refilled Abilify and lexapro.   For cholesterol, we refilled pravastatin  We hav refilled estradiol

## 2022-02-26 NOTE — Progress Notes (Signed)
Assessment/Plan:   Problem List Items Addressed This Visit       Other   Nicotine dependence   Relevant Orders   Ambulatory referral to Smoking Cessation Program   Female-to-female transgender person   Relevant Medications   estradiol (ESTRACE) 2 MG tablet   Anxiety   Relevant Medications   ARIPiprazole (ABILIFY) 2 MG tablet   escitalopram (LEXAPRO) 20 MG tablet   Hyperlipidemia LDL goal <130   Relevant Medications   pravastatin (PRAVACHOL) 80 MG tablet   Other Visit Diagnoses     Mild episode of recurrent major depressive disorder (HCC)    -  Primary   Relevant Medications   escitalopram (LEXAPRO) 20 MG tablet   GAD (generalized anxiety disorder)       Relevant Medications   escitalopram (LEXAPRO) 20 MG tablet   Mixed hyperlipidemia       Relevant Medications   pravastatin (PRAVACHOL) 80 MG tablet   Moderate episode of recurrent major depressive disorder ([place])       Relevant Medications   escitalopram (LEXAPRO) 20 MG tablet      Return in about 3 months (around 05/29/2022) for wellness exam (fasting labs) .     Subjective:  HPI:  Jaclyn Rowe is a 61 y.o. adult who has Nicotine dependence; Encounter for long-term current use of high risk medication; Hormone replacement therapy (HRT); Female-to-female transgender person; Anxiety; Hyperlipidemia LDL goal <130; Tubulovillous adenoma of colon; External hemorrhoid, bleeding; Coronary artery disease; COPD (chronic obstructive pulmonary disease) ([place]); and Aortic atherosclerosis ([place]) on their problem list..   She  has a past medical history of Anxiety, Depression, Hyperlipidemia, and Polyp, colonic..   She presents with chief complaint of Establish Care (Estradial and abilify refill) .  Patient is here to establish care.   Hyperlipidemia, established problem,  Current medication(s): Pravastatin.  Compliant without side effects. Lab Results  Component Value Date   CHOL 205 (H) 07/05/2019   HDL 74 07/05/2019    LDLCALC 116 (H) 07/05/2019   TRIG 83 07/05/2019   CHOLHDL 2.8 07/05/2019    ROS: No chest pain or shortness of breath. No myalgias.  Transgender MTF Hormone Therapy. Transition follow up Patient has been stable on estradiol 2 mg daily for 12+ years.  She has followed with endocrinology in the past.  Did discuss if patient would like to reestablish, she declined at this time. Testosterone, Total, LC/MS ng/dL 27.2    Comment:                          Female:                            Premenopausal    10.0 - 55.0                            Postmenopausal    7.0 - 40.0   Testosterone, Free 0.0 - 4.2 pg/mL 3.1  2.8   Sex Hormone Binding 17.3 - 125.0 nmol/L 72.8  72.9    Depression/Anxiety, established problem, Stable Current Medications: Escitalopram 20 mg, aripiprazole 2 mg Side Effects:  Current Symptoms/Interim History: Feels anxiety is difficult, but controlled well enough to the point that patient does not want medication, symptoms are grossly stable with medication plus lifestyle modifications     02/26/2022    9:26 AM  Depression screen [facility] 2/9  Decreased Interest 1  Down, Depressed, Hopeless 1  PHQ - 2 Score 2  Altered sleeping 0  Tired, decreased energy 0  Change in appetite 0  Feeling bad or failure about yourself  1  Trouble concentrating 0  Moving slowly or fidgety/restless 0  Suicidal thoughts 0  PHQ-9 Score 3  Difficult doing work/chores Not difficult at all       02/26/2022    9:26 AM  GAD 7 : Generalized Anxiety Score  Nervous, Anxious, on Edge 2  Control/stop worrying 2  Worry too much - different things 2  Trouble relaxing 1  Restless 1  Easily annoyed or irritable 0  Afraid - awful might happen 1  Total GAD 7 Score 9  Anxiety Difficulty Not difficult at all    ROS: No SI or HI.  Tobacco use.  Patient stated interest in quitting smoking.  Planning to pursue 1800QUITNOW.  Counseled on cessation and encourage patient follow-up with this.  May  also follow-up for medication assistance as needed. Tobacco Use: High Risk (02/26/2022)   Patient History    Smoking Tobacco Use: Every Day    Smokeless Tobacco Use: Never    Passive Exposure: Not on file     Past Surgical History:  Procedure Laterality Date   BREAST ENHANCEMENT SURGERY Bilateral    COLON SURGERY     COSMETIC SURGERY     LAPAROSCOPIC COLON RESECTION     ORCHIECTOMY Bilateral 2013   VAGINOPLASTY  2013    Outpatient Medications Prior to Visit  Medication Sig Dispense Refill   Lidocaine-Hydrocort, Perianal, 3-0.5 % CREA Apply 1 application topically in the morning and at bedtime. 85 g 2   ARIPiprazole (ABILIFY) 2 MG tablet TAKE 1 TABLET(2 MG) BY MOUTH DAILY 90 tablet 1   escitalopram (LEXAPRO) 20 MG tablet TAKE 1 TABLET(20 MG) BY MOUTH DAILY 90 tablet 1   estradiol (ESTRACE) 2 MG tablet TAKE 1 TABLET(2 MG) BY MOUTH DAILY 90 tablet 0   pravastatin (PRAVACHOL) 80 MG tablet Take one tablet daily 90 tablet 3   No facility-administered medications prior to visit.    Family History  Problem Relation Age of Onset   Colon cancer Neg Hx    Esophageal cancer Neg Hx    Rectal cancer Neg Hx    Stomach cancer Neg Hx     Social History   Socioeconomic History   Marital status: Married    Spouse name: Not on file   Number of children: 2   Years of education: Not on file   Highest education level: Not on file  Occupational History   Not on file  Tobacco Use   Smoking status: Every Day    Packs/day: 1.50    Years: 38.00    Total pack years: 57.00    Types: Cigarettes   Smokeless tobacco: Never  Vaping Use   Vaping Use: Never used  Substance and Sexual Activity   Alcohol use: Yes    Alcohol/week: 15.0 standard drinks of alcohol    Types: 15 Standard drinks or equivalent per week   Drug use: Not Currently   Sexual activity: Not on file  Other Topics Concern   Not on file  Social History Narrative   Patient is transgender.   Social Determinants of Health    Financial Resource Strain: Low Risk  (09/28/2018)   Overall Financial Resource Strain (CARDIA)    Difficulty of Paying Living Expenses: Not hard at all  Food Insecurity: No Food Insecurity (09/28/2018)  Hunger Vital Sign    Worried About Running Out of Food in the Last Year: Never true    Ran Out of Food in the Last Year: Never true  Transportation Needs: No Transportation Needs (09/28/2018)   PRAPARE - Hydrologist (Medical): No    Lack of Transportation (Non-Medical): No  Physical Activity: Not on file  Stress: Not on file  Social Connections: Not on file  Intimate Partner Violence: Not on file                                                                                                 Objective:  Physical Exam: BP 120/76 (BP Location: Left Arm, Patient Position: Sitting, Cuff Size: Large)   Pulse 76   Temp 97.8 F (36.6 C) (Temporal)   Ht 5' 8.5" (1.74 m)   Wt 211 lb (95.7 kg)   SpO2 97%   BMI 31.62 kg/m    General: No acute distress. Awake and conversant.  Eyes: Normal conjunctiva, anicteric. Round symmetric pupils.  ENT: Hearing grossly intact. No nasal discharge.  Neck: Neck is supple. No masses or thyromegaly.  Respiratory: Respirations are non-labored. No auditory wheezing.  Skin: Warm. No rashes or ulcers.  Psych: Alert and oriented. Cooperative, Appropriate mood and affect, Normal judgment.  CV: No cyanosis or JVD MSK: Normal ambulation. No clubbing  Neuro: Sensation and CN II-XII grossly normal.        Alesia Banda, MD, MS

## 2022-04-26 ENCOUNTER — Encounter: Payer: Self-pay | Admitting: Family Medicine

## 2022-04-26 DIAGNOSIS — K644 Residual hemorrhoidal skin tags: Secondary | ICD-10-CM

## 2022-04-30 ENCOUNTER — Other Ambulatory Visit: Payer: Self-pay

## 2022-04-30 ENCOUNTER — Other Ambulatory Visit: Payer: Self-pay | Admitting: Nurse Practitioner

## 2022-04-30 DIAGNOSIS — K644 Residual hemorrhoidal skin tags: Secondary | ICD-10-CM

## 2022-04-30 MED ORDER — LIDOCAINE-HYDROCORT (PERIANAL) 3-0.5 % EX CREA: 1.0000 "application " | TOPICAL_CREAM | Freq: Two times a day (BID) | CUTANEOUS | 0 refills | Status: DC

## 2022-05-30 ENCOUNTER — Ambulatory Visit: Payer: No Typology Code available for payment source | Admitting: Family Medicine

## 2022-05-30 ENCOUNTER — Encounter: Payer: Self-pay | Admitting: Family Medicine

## 2022-05-30 VITALS — BP 120/82 | HR 75 | Temp 97.5°F | Wt 210.0 lb

## 2022-05-30 DIAGNOSIS — Z79818 Long term (current) use of other agents affecting estrogen receptors and estrogen levels: Secondary | ICD-10-CM | POA: Diagnosis not present

## 2022-05-30 DIAGNOSIS — I2510 Atherosclerotic heart disease of native coronary artery without angina pectoris: Secondary | ICD-10-CM

## 2022-05-30 DIAGNOSIS — Z125 Encounter for screening for malignant neoplasm of prostate: Secondary | ICD-10-CM | POA: Diagnosis not present

## 2022-05-30 DIAGNOSIS — E669 Obesity, unspecified: Secondary | ICD-10-CM | POA: Diagnosis not present

## 2022-05-30 DIAGNOSIS — Z5181 Encounter for therapeutic drug level monitoring: Secondary | ICD-10-CM

## 2022-05-30 DIAGNOSIS — I700 Atherosclerosis of aorta: Secondary | ICD-10-CM | POA: Diagnosis not present

## 2022-05-30 DIAGNOSIS — F17200 Nicotine dependence, unspecified, uncomplicated: Secondary | ICD-10-CM

## 2022-05-30 DIAGNOSIS — D751 Secondary polycythemia: Secondary | ICD-10-CM

## 2022-05-30 DIAGNOSIS — Z789 Other specified health status: Secondary | ICD-10-CM | POA: Diagnosis not present

## 2022-05-30 DIAGNOSIS — Z7185 Encounter for immunization safety counseling: Secondary | ICD-10-CM

## 2022-05-30 DIAGNOSIS — Z6831 Body mass index (BMI) 31.0-31.9, adult: Secondary | ICD-10-CM

## 2022-05-30 DIAGNOSIS — Z0000 Encounter for general adult medical examination without abnormal findings: Secondary | ICD-10-CM

## 2022-05-30 DIAGNOSIS — E785 Hyperlipidemia, unspecified: Secondary | ICD-10-CM

## 2022-05-30 DIAGNOSIS — F419 Anxiety disorder, unspecified: Secondary | ICD-10-CM

## 2022-05-30 LAB — MICROALBUMIN / CREATININE URINE RATIO
Creatinine,U: 133.9 mg/dL
Microalb Creat Ratio: 0.5 mg/g (ref 0.0–30.0)
Microalb, Ur: <0.7 mg/dL (ref 0.0–1.9)

## 2022-05-30 LAB — CBC WITH DIFFERENTIAL/PLATELET
Basophils Absolute: 0.1 10*3/uL (ref 0.0–0.1)
Basophils Relative: 0.9 % (ref 0.0–3.0)
Eosinophils Absolute: 0.2 10*3/uL (ref 0.0–0.7)
Eosinophils Relative: 2.7 % (ref 0.0–5.0)
HCT: 45.6 % (ref 36.0–46.0)
Hemoglobin: 15.7 g/dL — ABNORMAL HIGH (ref 12.0–15.0)
Lymphocytes Relative: 14.1 % (ref 12.0–46.0)
Lymphs Abs: 1 10*3/uL (ref 0.7–4.0)
MCHC: 34.5 g/dL (ref 30.0–36.0)
MCV: 93.9 fl (ref 78.0–100.0)
Monocytes Absolute: 0.6 10*3/uL (ref 0.1–1.0)
Monocytes Relative: 8.1 % (ref 3.0–12.0)
Neutro Abs: 5.3 10*3/uL (ref 1.4–7.7)
Neutrophils Relative %: 74.2 % (ref 43.0–77.0)
Platelets: 278 10*3/uL (ref 150.0–400.0)
RBC: 4.85 Mil/uL (ref 3.87–5.11)
RDW: 13.4 % (ref 11.5–15.5)
WBC: 7.1 10*3/uL (ref 4.0–10.5)

## 2022-05-30 LAB — COMPREHENSIVE METABOLIC PANEL
ALT: 11 U/L (ref 0–35)
AST: 11 U/L (ref 0–37)
Albumin: 4 g/dL (ref 3.5–5.2)
Alkaline Phosphatase: 64 U/L (ref 39–117)
BUN: 12 mg/dL (ref 6–23)
CO2: 27 mEq/L (ref 19–32)
Calcium: 8.8 mg/dL (ref 8.4–10.5)
Chloride: 105 mEq/L (ref 96–112)
Creatinine, Ser: 0.85 mg/dL (ref 0.40–1.20)
GFR: 74.13 mL/min (ref >60.00)
Glucose, Bld: 95 mg/dL (ref 70–99)
Potassium: 4.7 mEq/L (ref 3.5–5.1)
Sodium: 139 mEq/L (ref 135–145)
Total Bilirubin: 0.3 mg/dL (ref 0.2–1.2)
Total Protein: 6.3 g/dL (ref 6.0–8.3)

## 2022-05-30 LAB — PSA: PSA: 0.1 ng/mL (ref 0.10–4.00)

## 2022-05-30 LAB — LIPID PANEL
Cholesterol: 174 mg/dL (ref 0–200)
HDL: 66 mg/dL (ref >39.00)
LDL Cholesterol: 96 mg/dL (ref 0–99)
NonHDL: 107.54
Total CHOL/HDL Ratio: 3
Triglycerides: 56 mg/dL (ref 0.0–149.0)
VLDL: 11.2 mg/dL (ref 0.0–40.0)

## 2022-05-30 LAB — TSH: TSH: 1.44 u[IU]/mL (ref 0.35–5.50)

## 2022-05-30 LAB — HEMOGLOBIN A1C: Hgb A1c MFr Bld: 5.6 % (ref 4.6–6.5)

## 2022-05-30 NOTE — Assessment & Plan Note (Signed)
-  Advised on the importance of smoking cessation for reducing the risk of chronic diseases, including cardiovascular disease and respiratory illnesses. - Re-assured that support is available when patient is ready to quit.

## 2022-05-30 NOTE — Progress Notes (Signed)
Assessment  Assessment/Plan:   Problem List Items Addressed This Visit       Cardiovascular and Mediastinum   Coronary artery disease   Relevant Orders   TSH (Completed)   Lipid panel (Completed)   Hemoglobin A1c (Completed)   Microalbumin / creatinine urine ratio (Completed)   Urinalysis, Routine w reflex microscopic   Vitamin D 1,25 dihydroxy   CBC with Differential/Platelet (Completed)   Comprehensive metabolic panel (Completed)   Testosterone,Free and Total   Estrogens, total   FSH/LH   MM DIGITAL SCREENING BILATERAL   Aortic atherosclerosis (HCC) - Primary   Relevant Orders   TSH (Completed)   Lipid panel (Completed)   Hemoglobin A1c (Completed)   Microalbumin / creatinine urine ratio (Completed)   Urinalysis, Routine w reflex microscopic   Vitamin D 1,25 dihydroxy   CBC with Differential/Platelet (Completed)   Comprehensive metabolic panel (Completed)   Testosterone,Free and Total   Estrogens, total   FSH/LH     Other   Tobacco use disorder    - Advised on the importance of smoking cessation for reducing the risk of chronic diseases, including cardiovascular disease and respiratory illnesses. - Re-assured that support is available when patient is ready to quit.      Relevant Orders   TSH (Completed)   Lipid panel (Completed)   Hemoglobin A1c (Completed)   Microalbumin / creatinine urine ratio (Completed)   Urinalysis, Routine w reflex microscopic   Vitamin D 1,25 dihydroxy   CBC with Differential/Platelet (Completed)   Comprehensive metabolic panel (Completed)   Testosterone,Free and Total   Estrogens, total   FSH/LH   MM DIGITAL SCREENING BILATERAL   Female-to-female transgender person   Relevant Orders   TSH (Completed)   Lipid panel (Completed)   Hemoglobin A1c (Completed)   Microalbumin / creatinine urine ratio (Completed)   Urinalysis, Routine w reflex microscopic   Vitamin D 1,25 dihydroxy   CBC with Differential/Platelet (Completed)    Comprehensive metabolic panel (Completed)   Testosterone,Free and Total   Estrogens, total   FSH/LH   MM DIGITAL SCREENING BILATERAL   Ambulatory referral to Endocrinology   Anxiety    - Current management with Abilify and Lexapro is effective; no changes recommended. - Counseling conducted on stress management techniques and work-life balance.      HLD (hyperlipidemia)    - Continue monitoring lipid levels on Pravastatin; consider adjusting the dose based on repeat cholesterol levels. - Counseling on diet and exercise emphasized for cardiovascular risk reduction.      Encounter for monitoring continuous estrogen therapy    - Advised on continuing estrogen therapy as well-tolerated. - Discussion on the potential risk for thromboembolic events and cardiovascular risks associated with long-term hormone therapy.      Relevant Orders   Ambulatory referral to Endocrinology   Vaccine counseling    - Counseled on the benefits and importance of receiving the shingles vaccine (Shingrix), with a recommendation to complete the 2-shot series. - Reviewed and updated other routine immunizations as per guidelines.      Other Visit Diagnoses     Class 1 obesity with body mass index (BMI) of 31.0 to 31.9 in adult, unspecified obesity type, unspecified whether serious comorbidity present       Relevant Orders   TSH (Completed)   Lipid panel (Completed)   Hemoglobin A1c (Completed)   Microalbumin / creatinine urine ratio (Completed)   Urinalysis, Routine w reflex microscopic   Vitamin D 1,25 dihydroxy   CBC with  Differential/Platelet (Completed)   Comprehensive metabolic panel (Completed)   Testosterone,Free and Total   Estrogens, total   FSH/LH   MM DIGITAL SCREENING BILATERAL   Screening for malignant neoplasm of prostate       Relevant Orders   PSA(Must document that pt has been informed of limitations of PSA testing.) (Completed)   Routine general medical examination at a health care  facility       Relevant Orders   TSH (Completed)   Lipid panel (Completed)   Hemoglobin A1c (Completed)   Microalbumin / creatinine urine ratio (Completed)   Urinalysis, Routine w reflex microscopic   Vitamin D 1,25 dihydroxy   CBC with Differential/Platelet (Completed)   Comprehensive metabolic panel (Completed)   Testosterone,Free and Total   Estrogens, total   FSH/LH   MM DIGITAL SCREENING BILATERAL   Polycythemia       Relevant Orders   Ambulatory referral to Endocrinology       There are no discontinued medications.  Patient Counseling(The following topics were reviewed and/or handout was given):  -Nutrition: Stressed importance of moderation in sodium/caffeine intake, saturated fat and cholesterol, caloric balance, sufficient intake of fresh fruits, vegetables, and fiber.  -Stressed the importance of regular exercise.   -Substance Abuse: Discussed cessation/primary prevention of tobacco, alcohol, or other drug use; driving or other dangerous activities under the influence; availability of treatment for abuse.   -Injury prevention: Discussed safety belts, safety helmets, smoke detector, smoking near bedding or upholstery.   -Sexuality: Discussed sexually transmitted diseases, partner selection, use of condoms, avoidance of unintended pregnancy and contraceptive alternatives.   -Dental health: Discussed importance of regular tooth brushing, flossing, and dental visits.  -Health maintenance and immunizations reviewed. Please refer to Health maintenance section.  Return to care in 1 year for next preventative visit.       Subjective:  Chief complaint Encounter date: 05/30/2022  Chief Complaint  Patient presents with   Annual Exam    Had coffee with creamer   Chief Complaint: The patient has no acute complaints today, just here for a physical.  History of Present Illness:  Problem 1: The patient, a 62 year old transgender female named Jaclyn Rowe, presents for a routine  physical. She has no new health concerns and reports that her chronic conditions, including anxiety, depression, and intermittent hemorrhoids, are stable at this time. She has been on hormone replacement therapy with estrogen and receiving mental health treatment with Lexapro, and she reports that these medications are well-tolerated and appear to be effective. There are no recent illnesses like COVID or other acute conditions.  Problem 2: Jaclyn Rowe's past medical history includes hypercholesterolemia managed with Pravastatin and a history of smoking. She acknowledges the importance of smoking cessation but does not express a current readiness to quit, citing high levels of stress related to work responsibilities. Her stress and anxiety are managed with Abilify, and she reports variable symptoms that are generally well-controlled.  Review of Systems: - Cardiovascular: No reports of chest pain or palpitations. - Respiratory: No shortness of breath, cough, or wheezing. - Gastrointestinal: No abdominal pain, nausea, vomiting, or changes in bowel habits. - Musculoskeletal: No swelling or edema reported. - Skin: Patient has tattoos, no new rashes or lesions reported. - Neurologic: No weakness or numbness. - Psychological: Reports stress related to work but is currently managing it.  Lifestyle: Diet: The patient is advised to maintain a balanced diet with plenty of fresh fruits, vegetables, and fiber while moderating sodium, caffeine intake, saturated fats, and cholesterol.  Exercise: Encouraged to resume walking and other forms of regular exercise for overall cardiovascular and mental health.  Dental: Has not been evaluated in the past year; it was recommended to follow up with a dentist. Vision: Has up-to-date contact lens prescription for distance vision in one eye. The examination by an eye doctor was recent.  ROS: Complete ROS otherwise negative.     05/30/2022    9:08 AM 02/26/2022    9:26 AM  03/29/2020    3:46 PM  GAD-7 Generalized Anxiety Disorder Screening Tool  1. Feeling Nervous, Anxious, or on Edge 1 2 0  2. Not Being Able to Stop or Control Worrying '1 2 1  '$ 3. Worrying Too Much About Different Things '1 2 1  '$ 4. Trouble Relaxing 1 1 0  5. Being So Restless it's Hard To Sit Still 0 1 0  6. Becoming Easily Annoyed or Irritable 0 0 0  7. Feeling Afraid As If Something Awful Might Happen '1 1 1  '$ Total GAD-7 Score '5 9 3  '$ Difficulty At Work, Home, or Getting  Along With Others? Not difficult at all Not difficult at all Not difficult at all      05/30/2022    9:08 AM 02/26/2022    9:26 AM 12/15/2020    7:52 AM 11/10/2020    9:02 AM 11/10/2020    8:47 AM 03/29/2020    3:45 PM 07/05/2019    3:21 PM  Depression screen PHQ 2/9  Decreased Interest 1 1 0 2 0 0 0  Down, Depressed, Hopeless 1 1 0 2 0 0 0  PHQ - 2 Score 2 2 0 4 0 0 0  Altered sleeping 0 0 0 0     Tired, decreased energy 0 0 0 1     Change in appetite 0 0 0 0     Feeling bad or failure about yourself  1 1 0 3     Trouble concentrating 0 0 0 1     Moving slowly or fidgety/restless 0 0 0 0     Suicidal thoughts 0 0 0 1     PHQ-9 Score 3 3 0 10     Difficult doing work/chores Not difficult at all Not difficult at all Not difficult at all Somewhat difficult       Health Maintenance Due  Topic Date Due   Zoster Vaccines- Shingrix (1 of 2) Never done   MAMMOGRAM  08/05/2019     PMH:  The following were reviewed and entered/updated in epic: Past Medical History:  Diagnosis Date   Anxiety    Depression    Hyperlipidemia    Polyp, colonic     Patient Active Problem List   Diagnosis Date Noted   Encounter for monitoring continuous estrogen therapy 05/30/2022   Vaccine counseling 05/30/2022   Coronary artery disease 11/13/2020   COPD (chronic obstructive pulmonary disease) ([employer]) 11/13/2020   Aortic atherosclerosis ([place]) 11/13/2020   External hemorrhoid, bleeding 12/09/2019   Tobacco use disorder 02/21/2018    Encounter for long-term current use of high risk medication 02/21/2018   Hormone replacement therapy (HRT) 02/21/2018   Female-to-female transgender person 02/21/2018   Anxiety 11/25/2013   HLD (hyperlipidemia) 11/25/2013   Tubulovillous adenoma of colon 11/25/2013    Past Surgical History:  Procedure Laterality Date   BREAST ENHANCEMENT SURGERY Bilateral    COLON SURGERY     COSMETIC SURGERY     LAPAROSCOPIC COLON RESECTION     ORCHIECTOMY Bilateral 2013  VAGINOPLASTY  2013    Family History  Problem Relation Age of Onset   Colon cancer Neg Hx    Esophageal cancer Neg Hx    Rectal cancer Neg Hx    Stomach cancer Neg Hx     Medications- reviewed and updated Outpatient Medications Prior to Visit  Medication Sig Dispense Refill   ARIPiprazole (ABILIFY) 2 MG tablet TAKE 1 TABLET(2 MG) BY MOUTH DAILY 90 tablet 1   escitalopram (LEXAPRO) 20 MG tablet TAKE 1 TABLET(20 MG) BY MOUTH DAILY 90 tablet 1   estradiol (ESTRACE) 2 MG tablet Take 1 tablet (2 mg total) by mouth daily. 90 tablet 3   Lidocaine-Hydrocort, Perianal, 3-0.5 % CREA Apply 1 application  topically in the morning and at bedtime. 85 g 0   pravastatin (PRAVACHOL) 80 MG tablet Take one tablet daily 90 tablet 3   No facility-administered medications prior to visit.    No Known Allergies  Social History   Socioeconomic History   Marital status: Married    Spouse name: Not on file   Number of children: 2   Years of education: Not on file   Highest education level: Not on file  Occupational History   Not on file  Tobacco Use   Smoking status: Every Day    Packs/day: 1.50    Years: 38.00    Total pack years: 57.00    Types: Cigarettes    Passive exposure: Never   Smokeless tobacco: Never  Vaping Use   Vaping Use: Never used  Substance and Sexual Activity   Alcohol use: Yes    Alcohol/week: 15.0 standard drinks of alcohol    Types: 15 Standard drinks or equivalent per week   Drug use: Not Currently    Sexual activity: Not on file  Other Topics Concern   Not on file  Social History Narrative   Patient is transgender.   Social Determinants of Health   Financial Resource Strain: Low Risk  (09/28/2018)   Overall Financial Resource Strain (CARDIA)    Difficulty of Paying Living Expenses: Not hard at all  Food Insecurity: No Food Insecurity (09/28/2018)   Hunger Vital Sign    Worried About Running Out of Food in the Last Year: Never true    Ran Out of Food in the Last Year: Never true  Transportation Needs: No Transportation Needs (09/28/2018)   PRAPARE - Hydrologist (Medical): No    Lack of Transportation (Non-Medical): No  Physical Activity: Not on file  Stress: Not on file  Social Connections: Not on file        Objective:  Physical Exam: BP 120/82 (BP Location: Left Arm, Patient Position: Sitting, Cuff Size: Large)   Pulse 75   Temp (!) 97.5 F (36.4 C) (Temporal)   Wt 210 lb (95.3 kg)   SpO2 98%   BMI 31.47 kg/m   Body mass index is 31.47 kg/m. Wt Readings from Last 3 Encounters:  05/30/22 210 lb (95.3 kg)  02/26/22 211 lb (95.7 kg)  11/10/20 208 lb 6.4 oz (94.5 kg)    Gen: NAD, resting comfortably CV: RRR with no murmurs appreciated Pulm: NWOB, CTAB with no crackles, wheezes, or rhonchi GI: Normal bowel sounds present. Soft, Nontender, Nondistended. MSK: no edema, cyanosis, or clubbing noted Skin: warm, dry Neuro: grossly normal, moves all extremities Psych: Normal affect and thought content      At today's visit, we discussed treatment options, associated risk and benefits, and engage in  counseling as needed.  Additionally the following were reviewed: Past medical records, past medical and surgical history, family and social background, as well as relevant laboratory results, imaging findings, and specialty notes, where applicable.  This message was generated using dictation software, and as a result, it may contain unintentional  typos or errors.  Nevertheless, extensive effort was made to accurately convey at the pertinent aspects of the patient visit.    There may have been are other unrelated non-urgent complaints, but due to the busy schedule and the amount of time already spent with her, time does not permit to address these issues at today's visit. Another appointment may have or has been requested to review these additional issues.   Jaclyn Lowenstein, MD, MS

## 2022-05-30 NOTE — Assessment & Plan Note (Signed)
-  Counseled on the benefits and importance of receiving the shingles vaccine (Shingrix), with a recommendation to complete the 2-shot series. - Reviewed and updated other routine immunizations as per guidelines.

## 2022-05-30 NOTE — Assessment & Plan Note (Signed)
-  Continue monitoring lipid levels on Pravastatin; consider adjusting the dose based on repeat cholesterol levels. - Counseling on diet and exercise emphasized for cardiovascular risk reduction.

## 2022-05-30 NOTE — Assessment & Plan Note (Signed)
-  Current management with Abilify and Lexapro is effective; no changes recommended. - Counseling conducted on stress management techniques and work-life balance.

## 2022-05-30 NOTE — Assessment & Plan Note (Signed)
-  Advised on continuing estrogen therapy as well-tolerated. - Discussion on the potential risk for thromboembolic events and cardiovascular risks associated with long-term hormone therapy.

## 2022-06-04 LAB — ESTROGENS, TOTAL: Estrogen: 313 pg/mL

## 2022-06-04 LAB — FSH/LH
FSH: 43.6 m[IU]/mL
LH: 28.5 m[IU]/mL

## 2022-06-04 LAB — VITAMIN D 1,25 DIHYDROXY
Vitamin D 1, 25 (OH)2 Total: 33 pg/mL (ref 18–72)
Vitamin D2 1, 25 (OH)2: <8 pg/mL
Vitamin D3 1, 25 (OH)2: 33 pg/mL

## 2022-06-08 LAB — TESTOSTERONE,FREE AND TOTAL
Testosterone, Free: 0.4 pg/mL (ref 0.0–4.2)
Testosterone: 15 ng/dL (ref 3–67)

## 2022-06-24 ENCOUNTER — Ambulatory Visit
Admission: RE | Admit: 2022-06-24 | Discharge: 2022-06-24 | Disposition: A | Discharge status: Home / Self Care | Payer: No Typology Code available for payment source | Source: Ambulatory Visit | Attending: Family Medicine

## 2022-06-24 ENCOUNTER — Other Ambulatory Visit: Payer: Self-pay | Admitting: Family Medicine

## 2022-06-24 DIAGNOSIS — F17200 Nicotine dependence, unspecified, uncomplicated: Secondary | ICD-10-CM

## 2022-06-24 DIAGNOSIS — Z789 Other specified health status: Secondary | ICD-10-CM

## 2022-06-24 DIAGNOSIS — E785 Hyperlipidemia, unspecified: Secondary | ICD-10-CM

## 2022-06-24 DIAGNOSIS — Z125 Encounter for screening for malignant neoplasm of prostate: Secondary | ICD-10-CM

## 2022-06-24 DIAGNOSIS — Z5181 Encounter for therapeutic drug level monitoring: Secondary | ICD-10-CM

## 2022-06-24 DIAGNOSIS — Z0000 Encounter for general adult medical examination without abnormal findings: Secondary | ICD-10-CM

## 2022-06-24 DIAGNOSIS — I700 Atherosclerosis of aorta: Secondary | ICD-10-CM

## 2022-06-24 DIAGNOSIS — F419 Anxiety disorder, unspecified: Secondary | ICD-10-CM

## 2022-06-24 DIAGNOSIS — E66811 Obesity, class 1: Secondary | ICD-10-CM

## 2022-06-24 DIAGNOSIS — D751 Secondary polycythemia: Secondary | ICD-10-CM

## 2022-06-24 DIAGNOSIS — E669 Obesity, unspecified: Secondary | ICD-10-CM

## 2022-06-24 DIAGNOSIS — Z7185 Encounter for immunization safety counseling: Secondary | ICD-10-CM

## 2022-06-24 DIAGNOSIS — I2510 Atherosclerotic heart disease of native coronary artery without angina pectoris: Secondary | ICD-10-CM

## 2022-09-28 ENCOUNTER — Other Ambulatory Visit: Payer: Self-pay | Admitting: Family Medicine

## 2022-09-28 DIAGNOSIS — F331 Major depressive disorder, recurrent, moderate: Secondary | ICD-10-CM

## 2022-09-30 NOTE — Telephone Encounter (Signed)
Chart supports rx. Last OV: 05/30/2022   

## 2022-11-15 ENCOUNTER — Ambulatory Visit (HOSPITAL_BASED_OUTPATIENT_CLINIC_OR_DEPARTMENT_OTHER): Payer: No Typology Code available for payment source

## 2022-11-15 ENCOUNTER — Ambulatory Visit
Admission: RE | Admit: 2022-11-15 | Discharge: 2022-11-15 | Disposition: A | Discharge status: Home / Self Care | Payer: No Typology Code available for payment source | Source: Ambulatory Visit | Attending: Acute Care | Admitting: Acute Care

## 2022-11-15 DIAGNOSIS — Z122 Encounter for screening for malignant neoplasm of respiratory organs: Secondary | ICD-10-CM

## 2022-11-15 DIAGNOSIS — Z87891 Personal history of nicotine dependence: Secondary | ICD-10-CM

## 2022-11-15 DIAGNOSIS — F17210 Nicotine dependence, cigarettes, uncomplicated: Secondary | ICD-10-CM

## 2022-11-18 ENCOUNTER — Other Ambulatory Visit: Payer: Self-pay

## 2022-11-18 DIAGNOSIS — E782 Mixed hyperlipidemia: Secondary | ICD-10-CM

## 2022-11-18 MED ORDER — PRAVASTATIN SODIUM 80 MG PO TABS: ORAL_TABLET | ORAL | 3 refills | Status: DC

## 2022-11-25 ENCOUNTER — Other Ambulatory Visit: Payer: Self-pay

## 2022-11-25 DIAGNOSIS — F17210 Nicotine dependence, cigarettes, uncomplicated: Secondary | ICD-10-CM

## 2022-11-25 DIAGNOSIS — Z122 Encounter for screening for malignant neoplasm of respiratory organs: Secondary | ICD-10-CM

## 2022-11-25 DIAGNOSIS — Z87891 Personal history of nicotine dependence: Secondary | ICD-10-CM

## 2022-12-29 IMAGING — CT CT CHEST LUNG CANCER SCREENING LOW DOSE W/O CM
2 of 3 series · 15 of 36 positions shown, 18 images · non-contrast
Comparison: Low-dose lung cancer screening chest CT 11/12/2019.

CLINICAL DATA: 60-year-old female current smoker with 40 pack-year
history of smoking. Lung cancer screening examination.

EXAM:
CT CHEST WITHOUT CONTRAST LOW-DOSE FOR LUNG CANCER SCREENING
TECHNIQUE: Multidetector CT imaging of the chest was performed following the
standard protocol without IV contrast.

[Series 2: axial st · axial · 0.75mm/px · z∈[-300,-30]mm · 12 of 64 slices shown, 15 images]
[im 5/64  mediastinal]
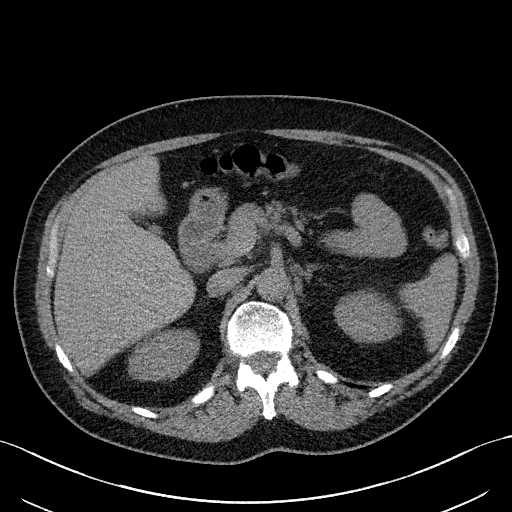
[im 5/64  lung]
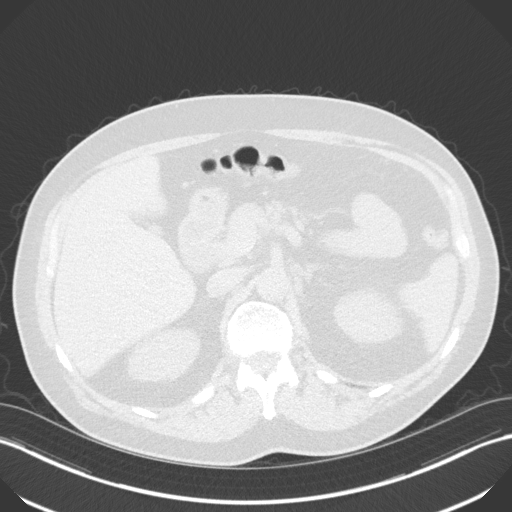
[im 10/64  lung]
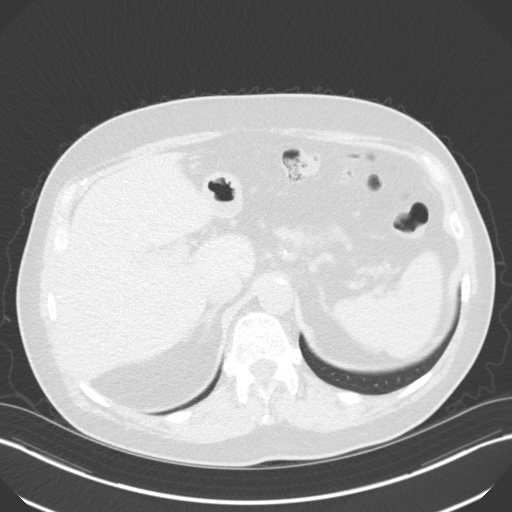
[im 15/64  lung]
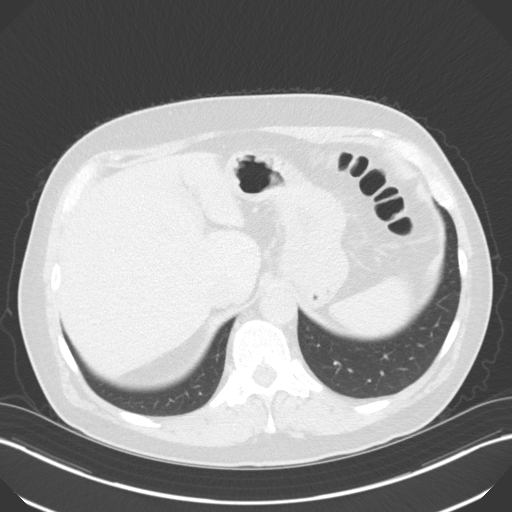
[im 19/64  lung]
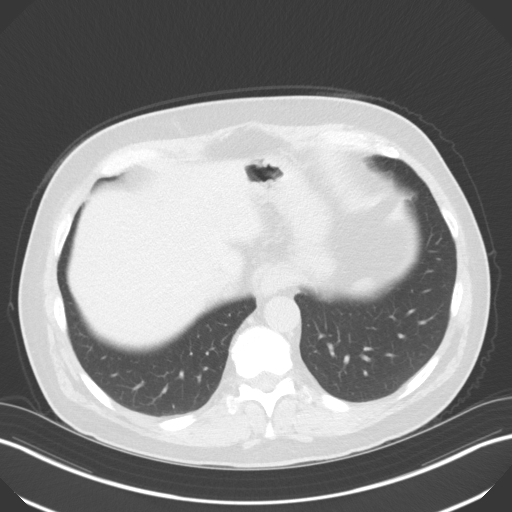
[im 24/64  mediastinal]
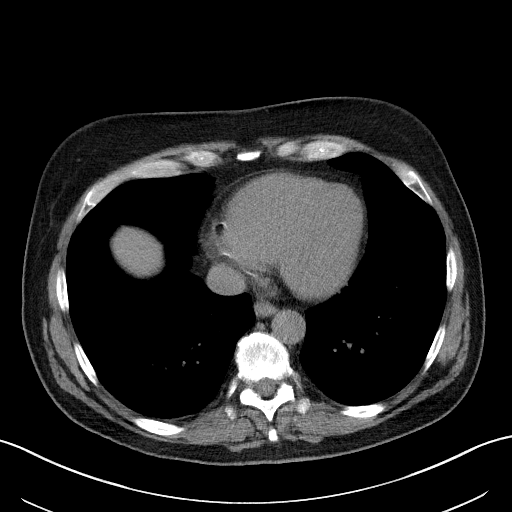
[im 24/64  lung]
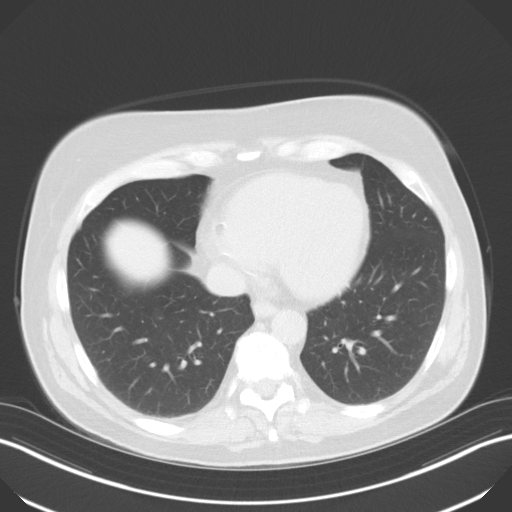
[im 29/64  lung]
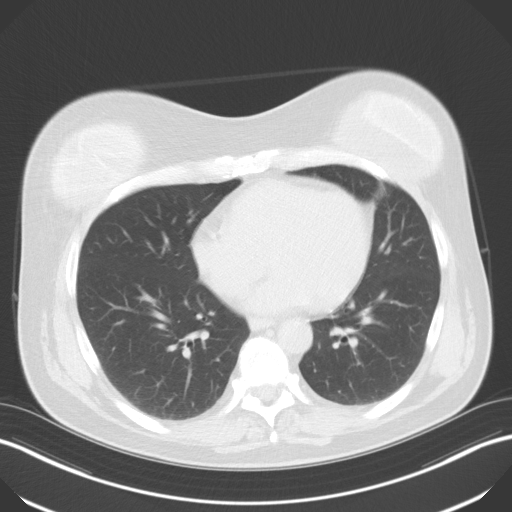
[im 36/64  lung]
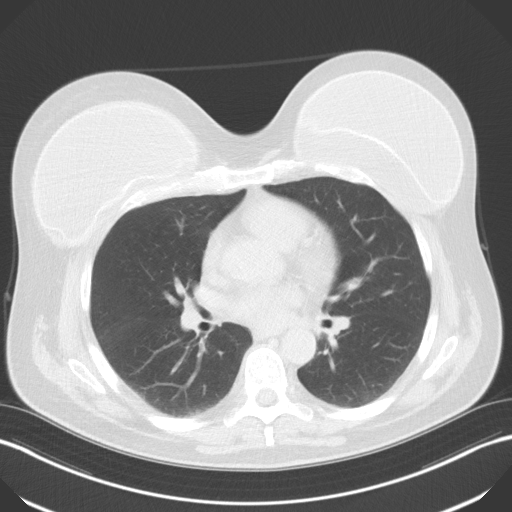
[im 40/64  lung]
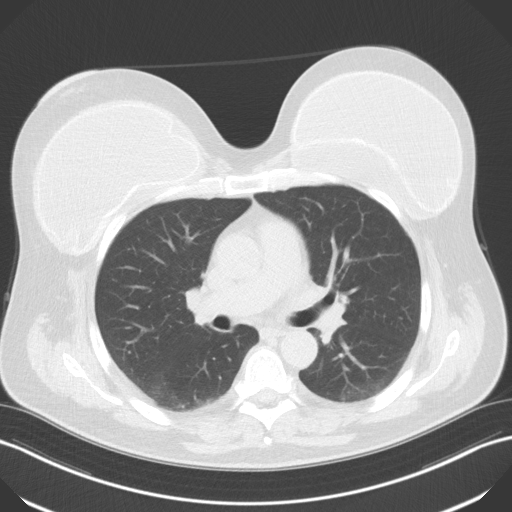
[im 45/64  mediastinal]
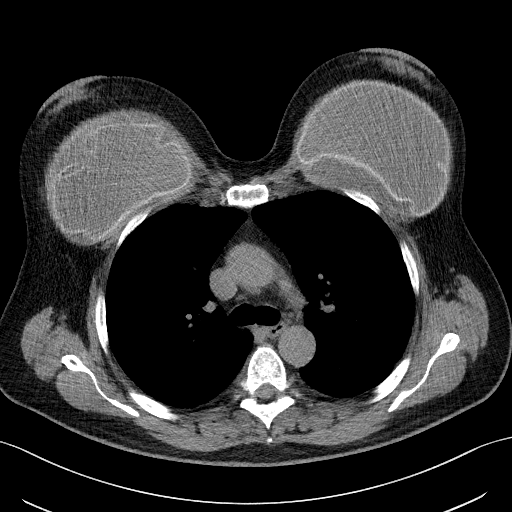
[im 45/64  lung]
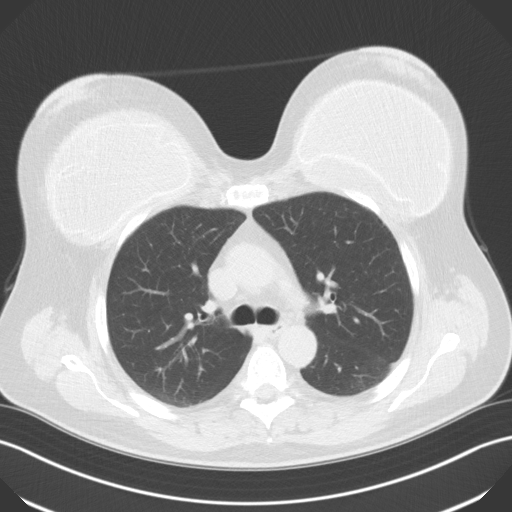
[im 50/64  lung]
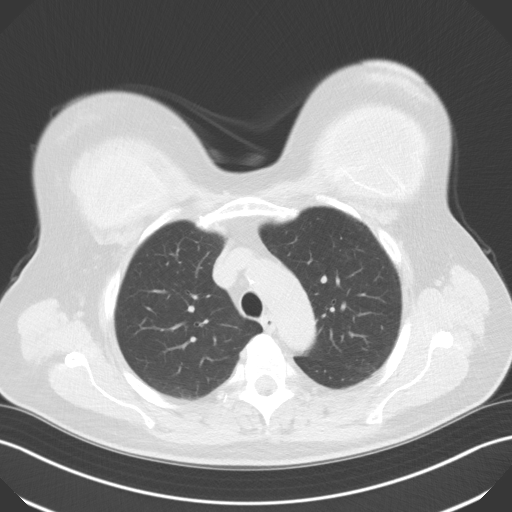
[im 54/64  lung]
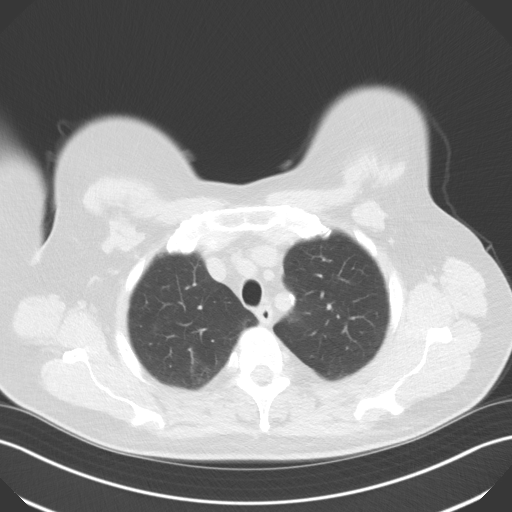
[im 59/64  lung]
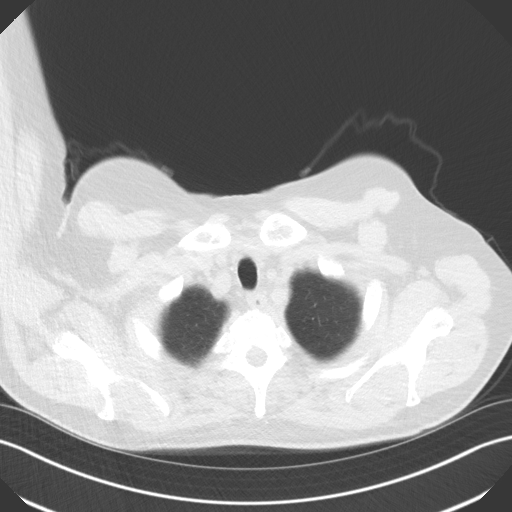

[Series 5: coronal · coronal · 0.66mm/px · 3 of 258 slices shown]
[im 52/258  lung]
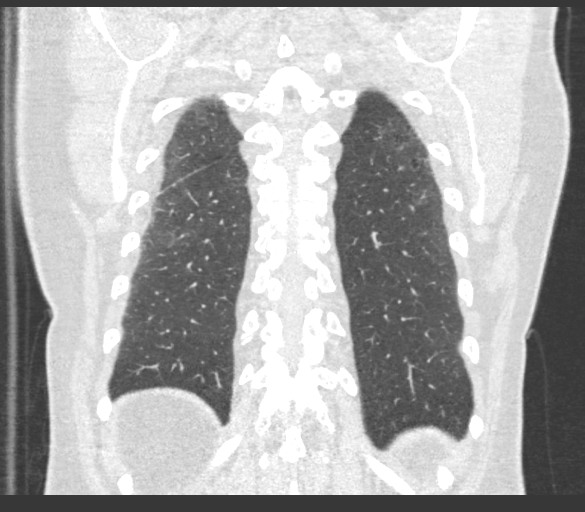
[im 103/258  lung]
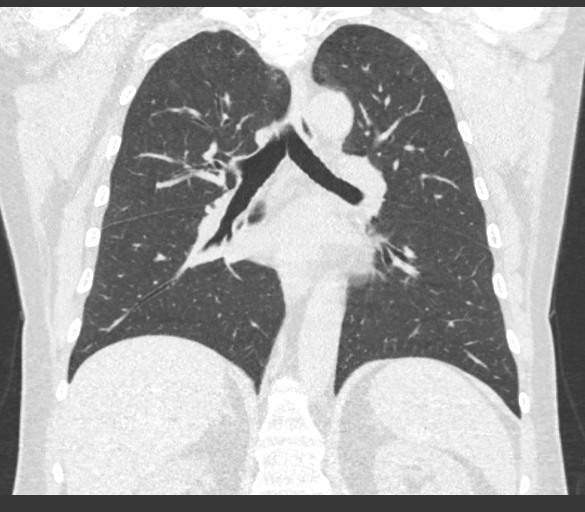
[im 155/258  lung]
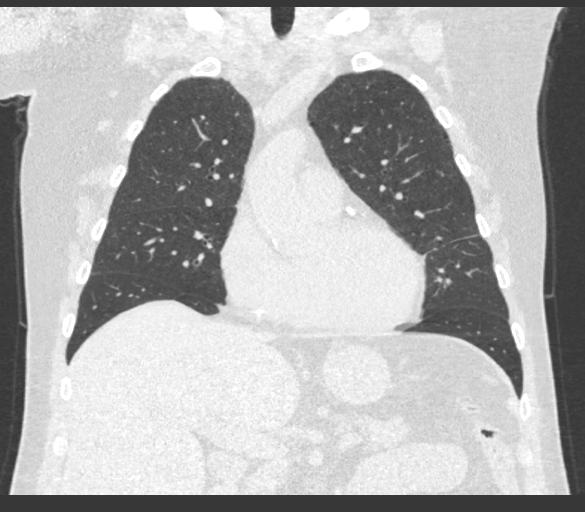

[15 of 36 positions shown; findings below may reference images not displayed]

FINDINGS: Cardiovascular: Heart size is normal. There is no significant
pericardial fluid, thickening or pericardial calcification. There is
aortic atherosclerosis, as well as atherosclerosis of the great
vessels of the mediastinum and the coronary arteries, including
calcified atherosclerotic plaque in the left main, left anterior
descending and right coronary arteries.

Mediastinum/Nodes: No pathologically enlarged mediastinal or hilar
lymph nodes. Please note that accurate exclusion of hilar adenopathy
is limited on noncontrast CT scans. Esophagus is unremarkable in
appearance. No axillary lymphadenopathy.

Lungs/Pleura: Tiny calcified granulomas are again noted in the
lungs. No other suspicious appearing pulmonary nodules or masses are
noted. No acute consolidative airspace disease. No pleural
effusions. Mild diffuse bronchial wall thickening with very mild
centrilobular and paraseptal emphysema.

Upper Abdomen: Aortic atherosclerosis.

Musculoskeletal: Bilateral breast implants are incidentally noted.
There are no aggressive appearing lytic or blastic lesions noted in
the visualized portions of the skeleton.
IMPRESSION: 1. Lung-RADS 1S, negative. Continue annual screening with low-dose
chest CT without contrast in 12 months.
2. The "S" modifier above refers to potentially clinically
significant non lung cancer related findings. Specifically, there is
aortic atherosclerosis, in addition to left main and 2 vessel
coronary artery disease. Please note that although the presence of
coronary artery calcium documents the presence of coronary artery
disease, the severity of this disease and any potential stenosis
cannot be assessed on this non-gated CT examination. Assessment for
potential risk factor modification, dietary therapy or pharmacologic
therapy may be warranted, if clinically indicated.
3. Mild diffuse bronchial wall thickening with very mild
centrilobular and paraseptal emphysema; imaging findings suggestive
of underlying COPD.

Aortic Atherosclerosis (ZMKIE-XJ9.9) and Emphysema ([id number].O).

## 2023-01-16 ENCOUNTER — Other Ambulatory Visit: Payer: Self-pay | Admitting: Family Medicine

## 2023-01-16 DIAGNOSIS — F419 Anxiety disorder, unspecified: Secondary | ICD-10-CM

## 2023-02-13 ENCOUNTER — Other Ambulatory Visit: Payer: Self-pay | Admitting: Family Medicine

## 2023-02-13 DIAGNOSIS — K644 Residual hemorrhoidal skin tags: Secondary | ICD-10-CM

## 2023-02-17 ENCOUNTER — Other Ambulatory Visit: Payer: Self-pay | Admitting: Family Medicine

## 2023-02-17 DIAGNOSIS — Z789 Other specified health status: Secondary | ICD-10-CM

## 2023-02-21 ENCOUNTER — Ambulatory Visit: Payer: BC Managed Care – PPO | Admitting: Family Medicine

## 2023-02-21 ENCOUNTER — Encounter: Payer: Self-pay | Admitting: Family Medicine

## 2023-02-21 VITALS — BP 124/82 | HR 88 | Temp 97.7°F | Wt 211.6 lb

## 2023-02-21 DIAGNOSIS — I2510 Atherosclerotic heart disease of native coronary artery without angina pectoris: Secondary | ICD-10-CM | POA: Diagnosis not present

## 2023-02-21 DIAGNOSIS — Z789 Other specified health status: Secondary | ICD-10-CM | POA: Diagnosis not present

## 2023-02-21 DIAGNOSIS — Z23 Encounter for immunization: Secondary | ICD-10-CM

## 2023-02-21 DIAGNOSIS — Z79890 Hormone replacement therapy (postmenopausal): Secondary | ICD-10-CM

## 2023-02-21 DIAGNOSIS — I700 Atherosclerosis of aorta: Secondary | ICD-10-CM

## 2023-02-21 DIAGNOSIS — F17200 Nicotine dependence, unspecified, uncomplicated: Secondary | ICD-10-CM

## 2023-02-21 DIAGNOSIS — E7849 Other hyperlipidemia: Secondary | ICD-10-CM

## 2023-02-21 DIAGNOSIS — I2583 Coronary atherosclerosis due to lipid rich plaque: Secondary | ICD-10-CM

## 2023-02-21 NOTE — Patient Instructions (Signed)
Call [facility] Telephone Service is available 24/7 toll-free at 1-800-QUIT-NOW [phone]). Interpretation services available for many languages. Spanish: 1-800-Dejelo-Ya [phone]) TTY: [phone] Or check online for more information:  [email or web address]

## 2023-02-21 NOTE — Assessment & Plan Note (Signed)
Coronary artery disease with significant cardiac calcifications found during lung cancer screening.  Plan:  Discussed the importance of balancing the benefits of estrogen with the underlying cardiac risks including atherosclerosis, coronary artery disease, age, sex, smoking, hyperlipidemia, hormonal replacement therapy Cholesterol managed with the maximum dose of pravastatin (80 mg), cannot tolerate alternative statin due to myalgias, will continue pravastatin Recommend smoking cessation, discussed potential options, patient elected to pursue OTC NRT, provided quit NOW hotline, adjustments to smoking habits and possible consultations with endocrinology and potentially cardiology were proposed. Consider introduction of low-dose aspirin ASA 81 mg daily therapy, patient declined today.  Consider cardiology for evaluation and management of cardiac risks, especially if develops any chest pain or other concerning cardiac symptoms

## 2023-02-21 NOTE — Progress Notes (Signed)
Assessment/Plan:   Problem List Items Addressed This Visit       Cardiovascular and Mediastinum   Coronary artery disease    Coronary artery disease with significant cardiac calcifications found during lung cancer screening.  Plan:  Discussed the importance of balancing the benefits of estrogen with the underlying cardiac risks including atherosclerosis, coronary artery disease, age, sex, smoking, hyperlipidemia, hormonal replacement therapy Cholesterol managed with the maximum dose of pravastatin (80 mg), cannot tolerate alternative statin due to myalgias, will continue pravastatin Recommend smoking cessation, discussed potential options, patient elected to pursue OTC NRT, provided quit NOW hotline, adjustments to smoking habits and possible consultations with endocrinology and potentially cardiology were proposed. Consider introduction of low-dose aspirin ASA 81 mg daily therapy, patient declined today.  Consider cardiology for evaluation and management of cardiac risks, especially if develops any chest pain or other concerning cardiac symptoms      Aortic atherosclerosis (HCC)     Other   Tobacco use disorder   Hormone replacement therapy (HRT)   Female-to-female transgender person - Primary   Relevant Orders   Ambulatory referral to Endocrinology   HLD (hyperlipidemia)   Other Visit Diagnoses     Immunization due       Relevant Orders   Flu vaccine trivalent PF, 6mos and older(Flulaval,Afluria,Fluarix,Fluzone) (Completed)       There are no discontinued medications.  Return if symptoms worsen or fail to improve.    Subjective:   Encounter date: 02/21/2023  Jaclyn Rowe is a 62 y.o. adult who has Tobacco use disorder; Encounter for long-term current use of high risk medication; Hormone replacement therapy (HRT); Female-to-female transgender person; Anxiety; HLD (hyperlipidemia); Tubulovillous adenoma of colon; External hemorrhoid, bleeding; Coronary artery  disease; COPD (chronic obstructive pulmonary disease) (HCC); Aortic atherosclerosis (HCC); Encounter for monitoring continuous estrogen therapy; and Vaccine counseling on their problem list..   She  has a past medical history of Anxiety, Depression, Hyperlipidemia, and Polyp, colonic.Marland Kitchen   Chief Complaint: Estrogen dosing and associated cardiac risks.  History of Present Illness:  Patient is here for a follow-up regarding estrogen dosing and to discuss the cardiac risks associated with it. They have a significant history of smoking, high cholesterol, and recent findings from a lung cancer screening which incidentally showed significant cardiac calcifications. Recent labs indicated erythrocytosis.   Patient expressed concern about their smoking habit and indicated difficulty in quitting despite understanding its risks. They have previously quit smoking for four years but relapsed due to stress.     02/21/2023    9:11 AM 05/30/2022    9:08 AM 02/26/2022    9:26 AM 12/15/2020    7:52 AM 11/10/2020    9:02 AM  Depression screen PHQ 2/9  Decreased Interest 0 1 1 0 2  Down, Depressed, Hopeless 0 1 1 0 2  PHQ - 2 Score 0 2 2 0 4  Altered sleeping 0 0 0 0 0  Tired, decreased energy 0 0 0 0 1  Change in appetite 0 0 0 0 0  Feeling bad or failure about yourself  1 1 1  0 3  Trouble concentrating 0 0 0 0 1  Moving slowly or fidgety/restless 0 0 0 0 0  Suicidal thoughts 0 0 0 0 1  PHQ-9 Score 1 3 3  0 10  Difficult doing work/chores Not difficult at all Not difficult at all Not difficult at all Not difficult at all Somewhat difficult      02/21/2023    9:11  AM 05/30/2022    9:08 AM 02/26/2022    9:26 AM 03/29/2020    3:46 PM  GAD 7 : Generalized Anxiety Score  Nervous, Anxious, on Edge 1 1 2  0  Control/stop worrying 1 1 2 1   Worry too much - different things 1 1 2 1   Trouble relaxing 1 1 1  0  Restless 0 0 1 0  Easily annoyed or irritable 0 0 0 0  Afraid - awful might happen 1 1 1 1   Total  GAD 7 Score 5 5 9 3   Anxiety Difficulty Not difficult at all Not difficult at all Not difficult at all Not difficult at all      Review of Systems  Constitutional:  Negative for chills, diaphoresis, fever, malaise/fatigue and weight loss.  HENT:  Negative for congestion, ear discharge, ear pain and hearing loss.   Eyes:  Negative for blurred vision, double vision, photophobia, pain, discharge and redness.  Respiratory:  Negative for cough, sputum production, shortness of breath and wheezing.   Cardiovascular:  Negative for chest pain, palpitations and leg swelling.  Gastrointestinal:  Negative for abdominal pain, blood in stool, constipation, diarrhea, heartburn, melena, nausea and vomiting.  Genitourinary:  Negative for dysuria, flank pain, frequency, hematuria and urgency.  Musculoskeletal:  Negative for myalgias.  Skin:  Negative for itching and rash.  Neurological:  Negative for dizziness, tingling, tremors, speech change, seizures, loss of consciousness, weakness and headaches.  Psychiatric/Behavioral:  Negative for depression, hallucinations, memory loss, substance abuse and suicidal ideas. The patient is not nervous/anxious and does not have insomnia.   All other systems reviewed and are negative.   Past Surgical History:  Procedure Laterality Date   AUGMENTATION MAMMAPLASTY     BREAST ENHANCEMENT SURGERY Bilateral    COLON SURGERY     COSMETIC SURGERY     LAPAROSCOPIC COLON RESECTION     ORCHIECTOMY Bilateral 2013   VAGINOPLASTY  2013    Outpatient Medications Prior to Visit  Medication Sig Dispense Refill   ARIPiprazole (ABILIFY) 2 MG tablet TAKE 1 TABLET(2 MG) BY MOUTH DAILY 90 tablet 1   escitalopram (LEXAPRO) 20 MG tablet TAKE 1 TABLET(20 MG) BY MOUTH DAILY 90 tablet 1   estradiol (ESTRACE) 2 MG tablet TAKE 1 TABLET(2 MG) BY MOUTH DAILY 90 tablet 0   Lidocaine-Hydrocort, Perianal, 3-0.5 % CREA APPLY TOPICALLY IN THE MORNING AND AT BEDTIME 85 g 0   pravastatin  (PRAVACHOL) 80 MG tablet Take one tablet daily 90 tablet 3   No facility-administered medications prior to visit.    Family History  Problem Relation Age of Onset   Colon cancer Neg Hx    Esophageal cancer Neg Hx    Rectal cancer Neg Hx    Stomach cancer Neg Hx    Breast cancer Neg Hx     Social History   Socioeconomic History   Marital status: Married    Spouse name: Not on file   Number of children: 2   Years of education: Not on file   Highest education level: Not on file  Occupational History   Not on file  Tobacco Use   Smoking status: Every Day    Current packs/day: 1.50    Average packs/day: 1.5 packs/day for 38.0 years (57.0 ttl pk-yrs)    Types: Cigarettes    Passive exposure: Never   Smokeless tobacco: Never  Vaping Use   Vaping status: Never Used  Substance and Sexual Activity   Alcohol use: Yes  Alcohol/week: 15.0 standard drinks of alcohol    Types: 15 Standard drinks or equivalent per week   Drug use: Not Currently   Sexual activity: Not on file  Other Topics Concern   Not on file  Social History Narrative   Patient is transgender.   Social Determinants of Health   Financial Resource Strain: Low Risk  (09/28/2018)   Overall Financial Resource Strain (CARDIA)    Difficulty of Paying Living Expenses: Not hard at all  Food Insecurity: No Food Insecurity (09/28/2018)   Hunger Vital Sign    Worried About Running Out of Food in the Last Year: Never true    Ran Out of Food in the Last Year: Never true  Transportation Needs: No Transportation Needs (09/28/2018)   PRAPARE - Administrator, Civil Service (Medical): No    Lack of Transportation (Non-Medical): No  Physical Activity: Not on file  Stress: Not on file  Social Connections: Not on file  Intimate Partner Violence: Not on file                                                                                                  Objective:  Physical Exam: BP 124/82 (BP Location: Left  Arm, Patient Position: Sitting, Cuff Size: Large)   Pulse 88   Temp 97.7 F (36.5 C) (Temporal)   Wt 211 lb 9.6 oz (96 kg)   SpO2 95%   BMI 31.71 kg/m     Physical Exam  No results found.  No results found for this or any previous visit (from the past 2160 hour(s)).     important suggestion  View Detailed Reports    important suggestion  View Condensed Results Report   FSH/LH Order: [id number] Collected 05/30/2022 09:10   2 Result Notes     2 Patient Communications    Component Ref Range & Units 8 mo ago  FSH mIU/mL 43.6  Comment:                     Reference Range .              Follicular Phase       2.5-10.2              Mid-cycle Peak         3.1-17.7              Luteal Phase           1.5- 9.1              Postmenopausal       23.0-116.3             .  LH mIU/mL 28.5  Comment:     Reference Range Follicular Phase  1.9-12.5 Mid-Cycle Peak    8.7-76.3 Luteal Phase      0.5-16.9 Postmenopausal    10.0-54.7     View Full Report     Specimen Collected: 05/30/22 09:10 Last Resulted: 06/04/22 18:41         Result Care Coordination   Result Notes  Trudee Kuster, CMA 05/31/2022  2:08 PM EST Back to Top    Patient reviewed results via mychart.   Garnette Gunner, MD 05/31/2022  9:36 AM EST     Please see interpretation written in the Patient Results Comments. Garnette Gunner, MD 05/31/2022 9:36 AM     Patient Communication   Append Comments   Seen Back to Top    All values are normal or within acceptable limits.   Medication changes / Follow up labs / Other changes or recommendations:   None Garnette Gunner, MD 06/10/2022 2:14 PM  Written by Garnette Gunner, MD on 06/10/2022  2:14 PM EST View Past Comments Seen by patient Pryor Ochoa on 06/10/2022  2:18 PM   Estrogens, total Order: [id number] Collected 05/30/2022 09:10   0 Result Notes     1 Patient Communication    Component Ref Range & Units 8 mo ago  Estrogen pg/mL 313   Comment: . Reference Ranges for Total Estrogen: .   Follicular Phase:      51-601   Luteal Phase:         69-6295   Postmenopausal:    < or = 214     View Full Report     Specimen Collected: 05/30/22 09:10 Last Resulted: 06/04/22 18:41         Result Care Coordination   Patient Communication   Append Comments   Seen Back to Top   All values are normal or within acceptable limits.   Medication changes / Follow up labs / Other changes or recommendations:   None Garnette Gunner, MD 06/10/2022 2:14 PM  Written by Garnette Gunner, MD on 06/10/2022  2:14 PM EST Seen by patient Pryor Ochoa on 06/10/2022  2:20 PM   Testosterone,Free and Total Order: [id number] Collected 05/30/2022 09:10   0 Result Notes     1 Patient Communication    Component Ref Range & Units 8 mo ago  Testosterone 3 - 67 ng/dL 15  Testosterone, Free 0.0 - 4.2 pg/mL 0.4     View Full Report     Narrative  Performed at:  [address] [place], [place]  [id number] Lab Director: Jolene Schimke MD, Phone:  [phone]    Specimen Collected: 05/30/22 09:10 Last Resulted: 06/08/22 14:35         Result Care Coordination   Patient Communication   Append Comments   Seen Back to Top   All values are normal or within acceptable limits.   Medication changes / Follow up labs / Other changes or recommendations:   None Garnette Gunner, MD 06/10/2022 2:14 PM  Written by Garnette Gunner, MD on 06/10/2022  2:14 PM EST Seen by patient Pryor Ochoa on 06/10/2022  2:17 PM   Comprehensive metabolic panel Order: [id number] Collected 05/30/2022 09:10   4 Result Notes     3 Patient Communications    Component Ref Range & Units 8 mo ago  Sodium 135 - 145 mEq/L 139  Potassium 3.5 - 5.1 mEq/L 4.7  Chloride 96 - 112 mEq/L 105  CO2 19 - 32 mEq/L 27  Glucose, Bld 70 - 99 mg/dL 95  BUN 6 - 23 mg/dL 12  Creatinine, Ser 3.29 - 1.20 mg/dL 5.18  Total Bilirubin 0.2 -  1.2 mg/dL 0.3  Alkaline Phosphatase 39 - 117 U/L 64  AST 0 - 37 U/L 11  ALT 0 - 35 U/L 11  Total Protein 6.0 - 8.3 g/dL 6.3  Albumin 3.5 - 5.2 g/dL 4.0  GFR >16.10 mL/min 74.13  Comment: Calculated using the CKD-EPI Creatinine Equation (2021)  Calcium 8.4 - 10.5 mg/dL 8.8     View Full Report     Specimen Collected: 05/30/22 09:10 Last Resulted: 05/30/22 14:02         Result Care Coordination   Result Notes   Trudee Kuster, CMA 05/31/2022  2:08 PM EST Back to Top    Patient reviewed results via mychart.   Garnette Gunner, MD 05/31/2022  9:36 AM EST     Please see interpretation written in the Patient Results Comments. Garnette Gunner, MD 05/31/2022 9:36 AM   Trudee Kuster, CMA 05/31/2022  8:13 AM EST     Patient reviewed results via mychart.   Garnette Gunner, MD 05/30/2022  4:38 PM EST     Please see interpretation written in the Patient Results Comments. Garnette Gunner, MD 05/30/2022 4:33 PM     Patient Communication   Append Comments   Seen Back to Top    All values are normal or within acceptable limits.   Medication changes / Follow up labs / Other changes or recommendations:   None Garnette Gunner, MD 06/10/2022 2:14 PM  Written by Garnette Gunner, MD on 06/10/2022  2:14 PM EST View Past Comments Seen by patient Pryor Ochoa on 06/10/2022  2:17 PM    Contains abnormal data CBC with Differential/Platelet Order: [id number] Collected 05/30/2022 09:10   4 Result Notes     3 Patient Communications    Component Ref Range & Units 8 mo ago  WBC 4.0 - 10.5 K/uL 7.1  RBC 3.87 - 5.11 Mil/uL 4.85  Hemoglobin 12.0 - 15.0 g/dL 11.9 High   HCT 14.7 - 46.0 % 45.6  MCV 78.0 - 100.0 fl 93.9  MCHC 30.0 - 36.0 g/dL 82.9  RDW 56.2 - 13.0 % 13.4  Platelets 150.0 - 400.0 K/uL 278.0  Neutrophils Relative % 43.0 - 77.0 % 74.2  Lymphocytes Relative 12.0 - 46.0 % 14.1  Monocytes Relative 3.0 - 12.0 % 8.1  Eosinophils Relative 0.0 - 5.0 % 2.7   Basophils Relative 0.0 - 3.0 % 0.9  Neutro Abs 1.4 - 7.7 K/uL 5.3  Lymphs Abs 0.7 - 4.0 K/uL 1.0  Monocytes Absolute 0.1 - 1.0 K/uL 0.6  Eosinophils Absolute 0.0 - 0.7 K/uL 0.2  Basophils Absolute 0.0 - 0.1 K/uL 0.1     View Full Report     Specimen Collected: 05/30/22 09:10 Last Resulted: 05/30/22 12:19         Result Care Coordination   Result Notes   Trudee Kuster, CMA 05/31/2022  2:08 PM EST Back to Top    Patient reviewed results via mychart.   Garnette Gunner, MD 05/31/2022  9:36 AM EST     Please see interpretation written in the Patient Results Comments. Garnette Gunner, MD 05/31/2022 9:36 AM   Trudee Kuster, CMA 05/31/2022  8:13 AM EST     Patient reviewed results via mychart.   Garnette Gunner, MD 05/30/2022  4:38 PM EST     Please see interpretation written in the Patient Results Comments. Garnette Gunner, MD 05/30/2022 4:33 PM     Patient Communication   Append Comments   Seen Back to Top    All values are normal or within acceptable limits.   Medication changes / Follow up labs /  Other changes or recommendations:   None Garnette Gunner, MD 06/10/2022 2:14 PM  Written by Garnette Gunner, MD on 06/10/2022  2:14 PM EST View Past Comments Seen by patient Pryor Ochoa on 06/10/2022  2:18 PM   Vitamin D 1,25 dihydroxy Order: [id number] Collected 05/30/2022 09:10   0 Result Notes     1 Patient Communication    Component Ref Range & Units 8 mo ago  Vitamin D 1, 25 (OH)2 Total 18 - 72 pg/mL 33  Vitamin D3 1, 25 (OH)2 pg/mL 33  Vitamin D2 1, 25 (OH)2 pg/mL <8  Comment: (Note) Vitamin D3, 1,25(OH)2 indicates both endogenous production and supplementation. Vitamin D2, 1,25(OH)2 is an indicator of exogenous sources, such as diet or supplementation. Interpretation and therapy are based on measurement of Vitamin D, 1,25 (OH)2, Total. . This test was developed, and its analytical performance characteristics have been determined by [employer]. It has not been cleared or approved by the FDA. This assay has been validated pursuant to the CLIA regulations and is used for clinical purposes. . For additional information, please refer to http://education.[facility].com/faq/FAQ199 (This link is being provided for informational/educational purposes only.) . [facility] [address] [place] [id number] [phone] Doris Cheadle. Arthur Holms, MD     View Full Report     Specimen Collected: 05/30/22 09:10 Last Resulted: 06/04/22 18:41         Result Care Coordination   Patient Communication   Append Comments   Seen Back to Top   All values are normal or within acceptable limits.   Medication changes / Follow up labs / Other changes or recommendations:   None Garnette Gunner, MD 06/10/2022 2:14 PM  Written by Garnette Gunner, MD on 06/10/2022  2:14 PM EST Seen by patient Pryor Ochoa on 06/10/2022  2:18 PM   Microalbumin / creatinine urine ratio Order: [id number] Collected 05/30/2022 09:10   4 Result Notes     3 Patient Communications    Component Ref Range & Units 8 mo ago  Microalb, Ur 0.0 - 1.9 mg/dL <4.6  Creatinine,U mg/dL 962.9  Microalb Creat Ratio 0.0 - 30.0 mg/g 0.5     View Full Report     Specimen Collected: 05/30/22 09:10 Last Resulted: 05/30/22 12:59         Result Care Coordination   Result Notes   Trudee Kuster, CMA 05/31/2022  2:08 PM EST Back to Top    Patient reviewed results via mychart.   Garnette Gunner, MD 05/31/2022  9:36 AM EST     Please see interpretation written in the Patient Results Comments. Garnette Gunner, MD 05/31/2022 9:36 AM   Trudee Kuster, CMA 05/31/2022  8:13 AM EST     Patient reviewed results via mychart.   Garnette Gunner, MD 05/30/2022  4:38 PM EST     Please see interpretation written in the Patient Results Comments. Garnette Gunner, MD 05/30/2022 4:33 PM     Patient Communication   Append  Comments   Seen Back to Top    All values are normal or within acceptable limits.   Medication changes / Follow up labs / Other changes or recommendations:   None Garnette Gunner, MD 06/10/2022 2:14 PM  Written by Garnette Gunner, MD on 06/10/2022  2:14 PM EST View Past Comments Seen by patient Pryor Ochoa on 06/10/2022  2:20 PM   Hemoglobin A1c Order: [id number] Collected  05/30/2022 09:10   4 Result Notes     3 Patient Communications    Component Ref Range & Units 8 mo ago  Hgb A1c MFr Bld 4.6 - 6.5 % 5.6  Comment: Glycemic Control Guidelines for People with Diabetes:Non Diabetic:  <6%Goal of Therapy: <7%Additional Action Suggested:  >8%     View Full Report     Specimen Collected: 05/30/22 09:10 Last Resulted: 05/30/22 12:32         Result Care Coordination   Result Notes   Trudee Kuster, CMA 05/31/2022  2:08 PM EST Back to Top    Patient reviewed results via mychart.   Garnette Gunner, MD 05/31/2022  9:36 AM EST     Please see interpretation written in the Patient Results Comments. Garnette Gunner, MD 05/31/2022 9:36 AM   Trudee Kuster, CMA 05/31/2022  8:13 AM EST     Patient reviewed results via mychart.   Garnette Gunner, MD 05/30/2022  4:38 PM EST     Please see interpretation written in the Patient Results Comments. Garnette Gunner, MD 05/30/2022 4:33 PM     Patient Communication   Append Comments   Seen Back to Top    All values are normal or within acceptable limits.   Medication changes / Follow up labs / Other changes or recommendations:   None Garnette Gunner, MD 06/10/2022 2:14 PM  Written by Garnette Gunner, MD on 06/10/2022  2:14 PM EST View Past Comments Seen by patient Pryor Ochoa on 06/10/2022  2:19 PM   Lipid panel Order: [id number] Collected 05/30/2022 09:10   4 Result Notes     3 Patient Communications    Component Ref Range & Units 8 mo ago  Cholesterol 0 - 200 mg/dL 308  Comment: ATP III Classification        Desirable:  < 200 mg/dL               Borderline High:  200 - 239 mg/dL          High:  > = 657 mg/dL  Triglycerides 0.0 - 846.9 mg/dL 62.9  Comment: Normal:  <150 mg/dLBorderline High:  150 - 199 mg/dL  HDL >52.84 mg/dL 13.24  VLDL 0.0 - 40.1 mg/dL 02.7  LDL Cholesterol 0 - 99 mg/dL 96  Total CHOL/HDL Ratio 3  Comment:                Men          Women1/2 Average Risk     3.4          3.3Average Risk          5.0          4.42X Average Risk          9.6          7.13X Average Risk          15.0          11.0                      NonHDL 107.54  Comment: NOTE:  Non-HDL goal should be 30 mg/dL higher than patient's LDL goal (i.e. LDL goal of < 70 mg/dL, would have non-HDL goal of < 100 mg/dL)     View Full Report     Specimen Collected: 05/30/22 09:10 Last Resulted: 05/30/22 14:02         Result Care Coordination  Result Notes   Trudee Kuster, CMA 05/31/2022  2:08 PM EST Back to Top    Patient reviewed results via mychart.   Garnette Gunner, MD 05/31/2022  9:36 AM EST     Please see interpretation written in the Patient Results Comments. Garnette Gunner, MD 05/31/2022 9:36 AM   Trudee Kuster, CMA 05/31/2022  8:13 AM EST     Patient reviewed results via mychart.   Garnette Gunner, MD 05/30/2022  4:38 PM EST     Please see interpretation written in the Patient Results Comments. Garnette Gunner, MD 05/30/2022 4:33 PM     Patient Communication   Append Comments   Seen Back to Top    All values are normal or within acceptable limits.   Medication changes / Follow up labs / Other changes or recommendations:   None Garnette Gunner, MD 06/10/2022 2:14 PM  Written by Garnette Gunner, MD on 06/10/2022  2:14 PM EST View Past Comments Seen by patient Pryor Ochoa on 06/10/2022  2:19 PM   TSH Order: [id number] Collected 05/30/2022 09:10   4 Result Notes     3 Patient Communications    Component Ref Range & Units 8 mo ago  TSH 0.35 - 5.50 uIU/mL 1.44     View  Full Report     Specimen Collected: 05/30/22 09:10 Last Resulted: 05/30/22 12:35         Result Care Coordination   Result Notes   Trudee Kuster, CMA 05/31/2022  2:08 PM EST Back to Top    Patient reviewed results via mychart.   Garnette Gunner, MD 05/31/2022  9:36 AM EST     Please see interpretation written in the Patient Results Comments. Garnette Gunner, MD 05/31/2022 9:36 AM   Trudee Kuster, CMA 05/31/2022  8:13 AM EST     Patient reviewed results via mychart.   Garnette Gunner, MD 05/30/2022  4:38 PM EST     Please see interpretation written in the Patient Results Comments. Garnette Gunner, MD 05/30/2022 4:33 PM     Patient Communication   Append Comments   Seen Back to Top    All values are normal or within acceptable limits.   Medication changes / Follow up labs / Other changes or recommendations:   None Garnette Gunner, MD 06/10/2022 2:14 PM  Written by Garnette Gunner, MD on 06/10/2022  2:14 PM EST View Past Comments Seen by patient Pryor Ochoa on 06/10/2022  2:19 PM   PSA(Must document that pt has been informed of limitations of PSA testing.) Order: [id number] Collected 05/30/2022 09:10   4 Result Notes     3 Patient Communications    Component Ref Range & Units 8 mo ago  PSA 0.10 - 4.00 ng/mL 0.10  Comment: Test performed using Access [employer]ch PSA Assay, a parmagnetic partical, chemiluminecent immunoassay.     View Full Report     Specimen Collected: 05/30/22 09:10 Last Resulted: 05/30/22 12:35         Result Care Coordination   Result Notes   Trudee Kuster, CMA 05/31/2022  2:08 PM EST Back to Top    Patient reviewed results via mychart.   Garnette Gunner, MD 05/31/2022  9:36 AM EST     Please see interpretation written in the Patient Results Comments. Garnette Gunner, MD 05/31/2022 9:36 AM   Trudee Kuster, CMA 05/31/2022  8:13 AM EST  Patient reviewed results via mychart.   Garnette Gunner, MD 05/30/2022  4:38 PM EST      Please see interpretation written in the Patient Results Comments. Garnette Gunner, MD 05/30/2022 4:33 PM     Patient Communication   Append Comments   Seen Back to Top    All values are normal or within acceptable limits.   Medication changes / Follow up labs / Other changes or recommendations:   None Garnette Gunner, MD 06/10/2022 2:14 PM  Written by Garnette Gunner, MD on 06/10/2022  2:14 PM EST View Past Comments Seen by patient Pryor Ochoa on 06/10/2022  2:19 PM   Urinalysis, Routine w reflex microscopic Order: [id number] Ordered 05/30/2022 08:44     Status: Active   0 Result Notes View Full Report               Result Care Coordination   Patient Communication   Not Released  Not seen Back to Top      CT CHEST LUNG CA SCREEN LOW DOSE W/O CM (Accession [id number]) (Order [id number]) Imaging Date: 11/15/2022 Department: [facility] AT [address] Released By: Ruel Favors Authorizing: Bevelyn Ngo, NP   Exam Status  Status  Final [99]   PACS Intelerad Image Link   Show images for CT CHEST LUNG CA SCREEN LOW DOSE W/O CM Study Result  Narrative & Impression  CLINICAL DATA:  62 year old female current smoker with 42 pack-year history of smoking. Lung cancer screening examination.   EXAM: CT CHEST WITHOUT CONTRAST LOW-DOSE FOR LUNG CANCER SCREENING   TECHNIQUE: Multidetector CT imaging of the chest was performed following the standard protocol without IV contrast.   RADIATION DOSE REDUCTION: This exam was performed according to the departmental dose-optimization program which includes automated exposure control, adjustment of the mA and/or kV according to patient size and/or use of iterative reconstruction technique.   COMPARISON:  Low-dose lung cancer screening chest CT 11/13/2021.   FINDINGS: Cardiovascular: Heart size is normal. There is no significant pericardial fluid, thickening or pericardial calcification.  There is aortic atherosclerosis, as well as atherosclerosis of the great vessels of the mediastinum and the coronary arteries, including calcified atherosclerotic plaque in the left main, left anterior descending, left circumflex and right coronary arteries.   Mediastinum/Nodes: No pathologically enlarged mediastinal or hilar lymph nodes. Please note that accurate exclusion of hilar adenopathy is limited on noncontrast CT scans. Esophagus is unremarkable in appearance. No axillary lymphadenopathy.   Lungs/Pleura: No suspicious appearing pulmonary nodules or masses are noted. No acute consolidative airspace disease. No pleural effusions. Mild diffuse bronchial wall thickening with very mild centrilobular and paraseptal emphysema.   Upper Abdomen: Aortic atherosclerosis.   Musculoskeletal: Bilateral breast implants incidentally noted. There are no aggressive appearing lytic or blastic lesions noted in the visualized portions of the skeleton.   IMPRESSION: 1. Lung-RADS 1S, negative. Continue annual screening with low-dose chest CT without contrast in 12 months. 2. The "S" modifier above refers to potentially clinically significant non lung cancer related findings. Specifically, there is aortic atherosclerosis, in addition to left main and three-vessel coronary artery disease. Please note that although the presence of coronary artery calcium documents the presence of coronary artery disease, the severity of this disease and any potential stenosis cannot be assessed on this non-gated CT examination. Assessment for potential risk factor modification, dietary therapy or pharmacologic therapy may be warranted, if clinically indicated. 3. Mild diffuse bronchial wall thickening with very mild  centrilobular and paraseptal emphysema; imaging findings suggestive of underlying COPD.   Aortic Atherosclerosis (ICD10-I70.0) and Emphysema (ICD10-J43.9).     Electronically Signed   By: Trudie Reed M.D.   On: 11/24/2022 13:30      Result History  CT CHEST LUNG CA SCREEN LOW DOSE W/O CM (Order #[id number]) on 11/24/2022 - Order Result History Report  MyChart Results Release  MyChart Status: Active  Results Release   Encounter-Level Documents on 11/15/2022:  Electronic signature on 11/15/2022 2:33 PM - E-signed Scan on 11/15/2022 2:53 PM by Default, Provider, MD Scan on 11/01/2022 1:50 PM by Terrace ArabiaBerkley Harvey      Order-Level Documents:  There are no order-level documents. Vitals  Height Weight BMI (Calculated)       Assessment/Recommendation  Assessment Side Recommendation       Imaging  Imaging Information    CT CHEST LUNG CA SCREEN LOW DOSE W/O CM: Patient Communication   Add Comments   Seen   Resulted by:  Signed Date/Time  Phone Pager  Florencia Reasons 11/24/2022  1:30 PM [phone]    Link to IR Documentation Timeline  Sedation   Reference Links  CT PROTOCOL           Study Notes   Adolphus Birchwood, RT on 11/15/2022  2:53 PM  CURRENT SMOKER  42 PACK YR HX NO SYMPTOMS NO CANCER NO SXS     Original Order  Ordered On Ordered By   11/15/2021  8:27 AM Karlton Lemon, RN         Order History    Parent Order ID Child Order ID  [id number]    External Result Report  External Result Report   Existing Charges  Charge Line Charge Code Status Charge Trigger Charge Type  None          CT CHEST LUNG CA SCREEN LOW DOSE W/O CM (Accession [id number]) (Order [id number]) Imaging Date: 11/15/2022 Department: [facility] AT [address] Released By: Ruel Favors Authorizing: Bevelyn Ngo, NP   Exam Status  Status  Final [99]   PACS Intelerad Image Link   Show images for CT CHEST LUNG CA SCREEN LOW DOSE W/O CM Study Result  Narrative & Impression  CLINICAL DATA:  62 year old female current smoker with 42 pack-year history of smoking. Lung cancer screening examination.    EXAM: CT CHEST WITHOUT CONTRAST LOW-DOSE FOR LUNG CANCER SCREENING   TECHNIQUE: Multidetector CT imaging of the chest was performed following the standard protocol without IV contrast.   RADIATION DOSE REDUCTION: This exam was performed according to the departmental dose-optimization program which includes automated exposure control, adjustment of the mA and/or kV according to patient size and/or use of iterative reconstruction technique.   COMPARISON:  Low-dose lung cancer screening chest CT 11/13/2021.   FINDINGS: Cardiovascular: Heart size is normal. There is no significant pericardial fluid, thickening or pericardial calcification. There is aortic atherosclerosis, as well as atherosclerosis of the great vessels of the mediastinum and the coronary arteries, including calcified atherosclerotic plaque in the left main, left anterior descending, left circumflex and right coronary arteries.   Mediastinum/Nodes: No pathologically enlarged mediastinal or hilar lymph nodes. Please note that accurate exclusion of hilar adenopathy is limited on noncontrast CT scans. Esophagus is unremarkable in appearance. No axillary lymphadenopathy.   Lungs/Pleura: No suspicious appearing pulmonary nodules or masses are noted. No acute consolidative airspace disease. No pleural effusions. Mild diffuse bronchial wall thickening with very mild centrilobular and  paraseptal emphysema.   Upper Abdomen: Aortic atherosclerosis.   Musculoskeletal: Bilateral breast implants incidentally noted. There are no aggressive appearing lytic or blastic lesions noted in the visualized portions of the skeleton.   IMPRESSION: 1. Lung-RADS 1S, negative. Continue annual screening with low-dose chest CT without contrast in 12 months. 2. The "S" modifier above refers to potentially clinically significant non lung cancer related findings. Specifically, there is aortic atherosclerosis, in addition to left main and  three-vessel coronary artery disease. Please note that although the presence of coronary artery calcium documents the presence of coronary artery disease, the severity of this disease and any potential stenosis cannot be assessed on this non-gated CT examination. Assessment for potential risk factor modification, dietary therapy or pharmacologic therapy may be warranted, if clinically indicated. 3. Mild diffuse bronchial wall thickening with very mild centrilobular and paraseptal emphysema; imaging findings suggestive of underlying COPD.   Aortic Atherosclerosis (ICD10-I70.0) and Emphysema (ICD10-J43.9).     Electronically Signed   By: Trudie Reed M.D.   On: 11/24/2022 13:30      Result History  CT CHEST LUNG CA SCREEN LOW DOSE W/O CM (Order #[id number]) on 11/24/2022 - Order Result History Report  MyChart Results Release  MyChart Status: Active  Results Release   Encounter-Level Documents on 11/15/2022:  Electronic signature on 11/15/2022 2:33 PM - E-signed Scan on 11/15/2022 2:53 PM by Default, Provider, MD Scan on 11/01/2022 1:50 PM by Terrace ArabiaBerkley Harvey      Order-Level Documents:  There are no order-level documents. Vitals  Height Weight BMI (Calculated)       Assessment/Recommendation  Assessment Side Recommendation       Imaging  Imaging Information    CT CHEST LUNG CA SCREEN LOW DOSE W/O CM: Patient Communication   Add Comments   Seen   Resulted by:  Signed Date/Time  Phone Pager  Florencia Reasons 11/24/2022  1:30 PM [phone]    Link to IR Documentation Timeline  Sedation   Reference Links  CT PROTOCOL           Study Notes   Adolphus Birchwood, RT on 11/15/2022  2:53 PM  CURRENT SMOKER  42 PACK YR HX NO SYMPTOMS NO CANCER NO SXS     Original Order  Ordered On Ordered By   11/15/2021  8:27 AM Karlton Lemon, RN         Order History    Parent Order ID Child Order ID  [id number]    External Result  Report  External Result Report   Existing Charges  Charge Line Charge Code Status Charge Trigger Charge Type  None             Garner Nash, MD, MS

## 2023-03-24 ENCOUNTER — Other Ambulatory Visit: Payer: Self-pay | Admitting: Family Medicine

## 2023-03-24 DIAGNOSIS — F331 Major depressive disorder, recurrent, moderate: Secondary | ICD-10-CM

## 2023-05-15 ENCOUNTER — Other Ambulatory Visit: Payer: Self-pay | Admitting: Family Medicine

## 2023-05-15 DIAGNOSIS — E782 Mixed hyperlipidemia: Secondary | ICD-10-CM

## 2023-05-15 DIAGNOSIS — Z789 Other specified health status: Secondary | ICD-10-CM

## 2023-05-15 NOTE — Addendum Note (Signed)
Addended by: Fanny Bien B on: 05/15/2023 01:09 PM   Modules accepted: Orders

## 2023-05-19 NOTE — Telephone Encounter (Addendum)
Patient notified of prescriptions refilled and he has an appointment with endocrinology on 05/28/2023.

## 2023-07-14 ENCOUNTER — Other Ambulatory Visit: Payer: Self-pay | Admitting: Family Medicine

## 2023-07-14 DIAGNOSIS — F419 Anxiety disorder, unspecified: Secondary | ICD-10-CM

## 2023-07-26 ENCOUNTER — Encounter (HOSPITAL_BASED_OUTPATIENT_CLINIC_OR_DEPARTMENT_OTHER): Payer: Self-pay

## 2023-07-26 ENCOUNTER — Other Ambulatory Visit: Payer: Self-pay

## 2023-07-26 ENCOUNTER — Emergency Department (HOSPITAL_BASED_OUTPATIENT_CLINIC_OR_DEPARTMENT_OTHER): Admission: EM | Admit: 2023-07-26 | Discharge: 2023-07-26 | Disposition: A | Discharge status: Home / Self Care

## 2023-07-26 DIAGNOSIS — K047 Periapical abscess without sinus: Secondary | ICD-10-CM | POA: Diagnosis not present

## 2023-07-26 DIAGNOSIS — K0889 Other specified disorders of teeth and supporting structures: Secondary | ICD-10-CM | POA: Diagnosis present

## 2023-07-26 MED ORDER — AMOXICILLIN-POT CLAVULANATE 875-125 MG PO TABS: 1.0000 | ORAL_TABLET | Freq: Two times a day (BID) | ORAL | 0 refills | Status: AC

## 2023-07-26 NOTE — Discharge Instructions (Signed)
 Please see a dentist.  We are prescribing you antibiotics.  Please take them as prescribed for the full course even if symptoms improve.  Return immediately if you develop fevers, chills, tongue or lip swelling, swelling continues to worsen, difficulty breathing, extreme headache, neck stiffness, sensitivity to light, or, please return if you develop any new or worsening symptoms that are concerning to you.

## 2023-07-26 NOTE — ED Triage Notes (Signed)
 Thursday night started having left lower dental pain. Swelling started Friday morning. Doesn't have dentist. Denies fevers

## 2023-07-26 NOTE — ED Notes (Signed)
Reviewed discharge instructions and medications with pt. Pt states understanding

## 2023-07-26 NOTE — ED Provider Notes (Signed)
 [facility] Provider Note   CSN: [id number] Arrival date & time: 07/26/23  4098     History  Chief Complaint  Patient presents with   Dental Pain    Jaclyn Rowe is a 63 y.o. adult.  This is a 63 year old female presenting emergency department with dental pain.  Reports pain for the last couple weeks.  Noticed some swelling to her left lower jaw Friday.  Denies fevers, chills.  No painful or difficulty swallowing.  No tongue or lip swelling.   Dental Pain      Home Medications Prior to Admission medications   Medication Sig Start Date End Date Taking? Authorizing Provider  ARIPiprazole (ABILIFY) 2 MG tablet TAKE 1 TABLET(2 MG) BY MOUTH DAILY 07/14/23   Garnette Gunner, MD  escitalopram (LEXAPRO) 20 MG tablet TAKE 1 TABLET(20 MG) BY MOUTH DAILY 03/24/23   Garnette Gunner, MD  Lidocaine-Hydrocort, Perianal, 3-0.5 % CREA APPLY TOPICALLY IN THE MORNING AND AT BEDTIME 02/14/23   Garnette Gunner, MD  pravastatin (PRAVACHOL) 80 MG tablet TAKE 1 TABLET BY MOUTH DAILY 05/15/23   Garnette Gunner, MD      Allergies    Patient has no known allergies.    Review of Systems   Review of Systems  Physical Exam Updated Vital Signs BP 107/82 (BP Location: Left Arm)   Pulse 82   Temp 98.6 F (37 C)   Resp 16   Ht 5' 8.5" (1.74 m)   Wt 90.7 kg   SpO2 97%   BMI 29.97 kg/m  Physical Exam Vitals and nursing note reviewed.  Constitutional:      General: She is not in acute distress. HENT:     Head: Normocephalic.     Nose: Nose normal.     Mouth/Throat:     Mouth: Mucous membranes are moist.     Comments: Poor dentition.  Floor mouth soft.  Uvula is midline.  No trismus.  No obvious periapical abscess Eyes:     Conjunctiva/sclera: Conjunctivae normal.  Cardiovascular:     Rate and Rhythm: Normal rate and regular rhythm.  Pulmonary:     Effort: Pulmonary effort is normal.  Abdominal:     General: Abdomen is flat.   Skin:    General: Skin is warm.  Neurological:     Mental Status: She is alert and oriented to person, place, and time.  Psychiatric:        Mood and Affect: Mood normal.        Behavior: Behavior normal.     ED Results / Procedures / Treatments   Labs (all labs ordered are listed, but only abnormal results are displayed) Labs Reviewed - No data to display  EKG None  Radiology No results found.  Procedures Procedures    Medications Ordered in ED Medications - No data to display  ED Course/ Medical Decision Making/ A&P                                 Medical Decision Making This is a 63 year old female presenting emergency department with left lower dental pain.  Afebrile vital signs reassuring.  Some minor swelling to the left cheek, but soft supple neck.  No trismus, uvula midline.  Low suspicion for deep space infection.  Floor mouth is soft, low suspicion for Ludwig's angina.  No obvious periapical abscess to drain.  Discussed definitive  care with dentist.  Will discharge with antibiotics.  Strict return precautions given.         Final Clinical Impression(s) / ED Diagnoses Final diagnoses:  None    Rx / DC Orders ED Discharge Orders     None         Coral Spikes, DO 07/26/23 1610

## 2023-07-28 ENCOUNTER — Telehealth: Payer: Self-pay

## 2023-07-28 NOTE — Transitions of Care (Post Inpatient/ED Visit) (Signed)
   07/28/2023  Name: Jaclyn Rowe MRN: [id number] DOB: 1960-11-10  Today's TOC FU Call Status: Today's TOC FU Call Status:: Unsuccessful Call (1st Attempt) Unsuccessful Call (1st Attempt) Date: 07/28/23  Attempted to reach the patient regarding the most recent Inpatient/ED visit.  Follow Up Plan: Additional outreach attempts will be made to reach the patient to complete the Transitions of Care (Post Inpatient/ED visit) call.   Signature Jodelle Green, RMA

## 2023-07-30 ENCOUNTER — Telehealth: Payer: Self-pay

## 2023-07-30 NOTE — Telephone Encounter (Signed)
 Returned call to patient, TOC completed, patient will follow up with dentist.

## 2023-07-30 NOTE — Transitions of Care (Post Inpatient/ED Visit) (Signed)
   07/30/2023  Name: Jaclyn Rowe MRN: [id number] DOB: 1960/10/14  Today's TOC FU Call Status: Today's TOC FU Call Status:: Unsuccessful Call (2nd Attempt) Unsuccessful Call (2nd Attempt) Date: 07/30/23  Attempted to reach the patient regarding the most recent Inpatient/ED visit.  Follow Up Plan: Additional outreach attempts will be made to reach the patient to complete the Transitions of Care (Post Inpatient/ED visit) call.   Signature  Tuwanda Vokes D, CMA

## 2023-07-30 NOTE — Telephone Encounter (Signed)
 Copied from CRM [id number]. Topic: General - Other >> Jul 30, 2023  8:43 AM Saverio Danker wrote: Reason for CRM: Patient is calling because she received a call about her recent hospital visit. Would like a call back at [phone]

## 2023-07-30 NOTE — Transitions of Care (Post Inpatient/ED Visit) (Signed)
   07/30/2023  Name: Arlyn Bumpus MRN: [id number] DOB: 06-23-1960  Today's TOC FU Call Status: Today's TOC FU Call Status:: Successful TOC FU Call Completed TOC FU Call Complete Date: 07/30/23 Patient's Name and Date of Birth confirmed.  Transition Care Management Follow-up Telephone Call Date of Discharge: 07/26/23 Discharge Facility: [facility] Type of Discharge: Emergency Department Reason for ED Visit: Other: How have you been since you were released from the hospital?: Better Any questions or concerns?: No  Items Reviewed: Did you receive and understand the discharge instructions provided?: Yes Medications obtained,verified, and reconciled?: Yes (Medications Reviewed) Any new allergies since your discharge?: No Dietary orders reviewed?: NA Do you have support at home?: Yes  Medications Reviewed Today: Medications Reviewed Today   Medications were not reviewed in this encounter     Home Care and Equipment/Supplies: Were Home Health Services Ordered?: NA Any new equipment or medical supplies ordered?: NA  Functional Questionnaire: Do you need assistance with bathing/showering or dressing?: No Do you need assistance with meal preparation?: No Do you need assistance with eating?: No Do you have difficulty maintaining continence: No Do you need assistance with getting out of bed/getting out of a chair/moving?: No Do you have difficulty managing or taking your medications?: No  Follow up appointments reviewed: PCP Follow-up appointment confirmed?: NA Specialist Hospital Follow-up appointment confirmed?: Yes Follow-Up Specialty Provider:: Patient will follow up with dentist Do you need transportation to your follow-up appointment?: No Do you understand care options if your condition(s) worsen?: Yes-patient verbalized understanding    SIGNATURE Brynja Marker D, CMA

## 2023-08-10 ENCOUNTER — Other Ambulatory Visit: Payer: Self-pay | Admitting: Family Medicine

## 2023-08-10 DIAGNOSIS — Z789 Other specified health status: Secondary | ICD-10-CM

## 2023-09-20 ENCOUNTER — Other Ambulatory Visit: Payer: Self-pay | Admitting: Family Medicine

## 2023-09-20 DIAGNOSIS — F331 Major depressive disorder, recurrent, moderate: Secondary | ICD-10-CM

## 2023-11-21 ENCOUNTER — Other Ambulatory Visit: Payer: Self-pay | Admitting: Acute Care

## 2023-11-21 DIAGNOSIS — Z122 Encounter for screening for malignant neoplasm of respiratory organs: Secondary | ICD-10-CM

## 2023-11-21 DIAGNOSIS — F17210 Nicotine dependence, cigarettes, uncomplicated: Secondary | ICD-10-CM

## 2023-11-21 DIAGNOSIS — Z87891 Personal history of nicotine dependence: Secondary | ICD-10-CM

## 2023-11-28 ENCOUNTER — Other Ambulatory Visit: Payer: Self-pay | Admitting: Family Medicine

## 2023-11-28 ENCOUNTER — Ambulatory Visit (HOSPITAL_BASED_OUTPATIENT_CLINIC_OR_DEPARTMENT_OTHER)
Admission: RE | Admit: 2023-11-28 | Discharge: 2023-11-28 | Disposition: A | Discharge status: Home / Self Care | Source: Ambulatory Visit | Attending: *Deleted | Admitting: *Deleted

## 2023-11-28 DIAGNOSIS — I2510 Atherosclerotic heart disease of native coronary artery without angina pectoris: Secondary | ICD-10-CM | POA: Insufficient documentation

## 2023-11-28 DIAGNOSIS — J439 Emphysema, unspecified: Secondary | ICD-10-CM | POA: Insufficient documentation

## 2023-11-28 DIAGNOSIS — F17210 Nicotine dependence, cigarettes, uncomplicated: Secondary | ICD-10-CM | POA: Insufficient documentation

## 2023-11-28 DIAGNOSIS — Z122 Encounter for screening for malignant neoplasm of respiratory organs: Secondary | ICD-10-CM | POA: Insufficient documentation

## 2023-11-28 DIAGNOSIS — I700 Atherosclerosis of aorta: Secondary | ICD-10-CM | POA: Diagnosis not present

## 2023-11-28 DIAGNOSIS — K644 Residual hemorrhoidal skin tags: Secondary | ICD-10-CM

## 2023-11-28 DIAGNOSIS — Z87891 Personal history of nicotine dependence: Secondary | ICD-10-CM | POA: Diagnosis present

## 2023-12-02 ENCOUNTER — Other Ambulatory Visit: Payer: Self-pay | Admitting: Family Medicine

## 2023-12-02 DIAGNOSIS — K644 Residual hemorrhoidal skin tags: Secondary | ICD-10-CM

## 2023-12-02 NOTE — Telephone Encounter (Signed)
 Copied from CRM [id number]. Topic: Clinical - Medication Refill >> Dec 02, 2023 12:46 PM Paige D wrote: Medication:  Lidocaine -Hydrocort , Perianal, 3-0.5 % CREA    Has the patient contacted their pharmacy? Yes (Agent: If no, request that the patient contact the pharmacy for the refill. If patient does not wish to contact the pharmacy document the reason why and proceed with request.) (Agent: If yes, when and what did the pharmacy advise?)  This is the patient's preferred pharmacy:  [id number],  - [address] AT [facility] [address] [place] [address] Phone: [phone] Fax: [phone]  Is this the correct pharmacy for this prescription? Yes If no, delete pharmacy and type the correct one.   Has the prescription been filled recently?   Is the patient out of the medication? Yes  Has the patient been seen for an appointment in the last year OR does the patient have an upcoming appointment? Yes  Can we respond through MyChart? Yes  Agent: Please be advised that Rx refills may take up to 3 business days. We ask that you follow-up with your pharmacy.

## 2023-12-11 ENCOUNTER — Other Ambulatory Visit: Payer: Self-pay

## 2023-12-11 DIAGNOSIS — Z122 Encounter for screening for malignant neoplasm of respiratory organs: Secondary | ICD-10-CM

## 2023-12-11 DIAGNOSIS — F17210 Nicotine dependence, cigarettes, uncomplicated: Secondary | ICD-10-CM

## 2023-12-11 DIAGNOSIS — Z87891 Personal history of nicotine dependence: Secondary | ICD-10-CM

## 2024-01-12 ENCOUNTER — Other Ambulatory Visit: Payer: Self-pay | Admitting: Family Medicine

## 2024-01-12 DIAGNOSIS — F419 Anxiety disorder, unspecified: Secondary | ICD-10-CM

## 2024-01-12 NOTE — Telephone Encounter (Signed)
 Copied from CRM #[id number]. Topic: Clinical - Medication Refill >> Jan 12, 2024  9:35 AM Pinkey ORN wrote: Medication: ARIPiprazole  (ABILIFY ) 2 MG tablet  Has the patient contacted their pharmacy? Yes (Agent: If no, request that the patient contact the pharmacy for the refill. If patient does not wish to contact the pharmacy document the reason why and proceed with request.) (Agent: If yes, when and what did the pharmacy advise?)  This is the patient's preferred pharmacy:  [facility] #[id number], [place] - [address] AT [facility] [address] [place] [address] Phone: [phone] Fax: [phone]  Is this the correct pharmacy for this prescription? Yes If no, delete pharmacy and type the correct one.   Has the prescription been filled recently? No  Is the patient out of the medication? No  Has the patient been seen for an appointment in the last year OR does the patient have an upcoming appointment? Yes  Can we respond through MyChart? Yes  Agent: Please be advised that Rx refills may take up to 3 business days. We ask that you follow-up with your pharmacy.

## 2024-03-01 ENCOUNTER — Telehealth: Payer: Self-pay

## 2024-03-01 ENCOUNTER — Other Ambulatory Visit: Payer: Self-pay | Admitting: Family Medicine

## 2024-03-01 DIAGNOSIS — F419 Anxiety disorder, unspecified: Secondary | ICD-10-CM

## 2024-03-01 NOTE — Telephone Encounter (Signed)
 Requesting: ARIPIPRAZOLE  2MG  TABLETS  Last Visit: Visit date not found Next Visit: 04/08/2024 Last Refill: 07/14/2023  Please Advise

## 2024-03-01 NOTE — Telephone Encounter (Signed)
 Copied from CRM [id number]. Topic: Clinical - Medication Question >> Mar 01, 2024 11:59 AM Viola F wrote: Reason for CRM: Patient called to follow up on refill request from 01/12/24 for the ARIPiprazole  (ABILIFY ) 2 MG tablet - she scheduled follow up for next available 04/08/24 and would like the script filled until then due to her anxiety. Please let patient know via MyChart when prescription is sent to the pharmacy.

## 2024-03-01 NOTE — Telephone Encounter (Signed)
 Pt requesting ARIPiprazole  (ABILIFY ) 2 MG tablet - she scheduled follow up for next available 04/08/24, Please advise

## 2024-03-02 ENCOUNTER — Ambulatory Visit: Payer: Self-pay

## 2024-03-02 NOTE — Telephone Encounter (Signed)
 FYI Only or Action Required?: Action required by provider: patient calling in to follow up on rx refill request for anxiety.  Patient was last seen in primary care on 02/21/2023 by Sebastian Beverley NOVAK, MD.  Called Nurse Triage reporting Anxiety.  Symptoms began several weeks ago.  Interventions attempted: Rest, hydration, or home remedies.  Symptoms are: gradually worsening.  Triage Disposition: See PCP When Office is Open (Within 3 Days)  Patient/caregiver understands and will follow disposition?: No, wishes to speak with PCP  Copied from CRM [id number]. Topic: Clinical - Red Word Triage >> Mar 02, 2024  1:48 PM Alfonso ORN wrote: Red Word that prompted transfer to Nurse Triage: pt calling to f/u on anxiety refill is experiencing increased/ high levels of anxiety. Reason for Disposition  MODERATE anxiety (e.g., persistent or frequent anxiety symptoms; interferes with sleep, school, or work)  Answer Assessment - Initial Assessment Questions 1. CONCERN: Did anything happen that prompted you to call today?      Increased anxiety 2. ANXIETY SYMPTOMS: Can you describe how you (your loved one; patient) have been feeling? (e.g., tense, restless, panicky, anxious, keyed up, overwhelmed, sense of impending doom).      Worry a lot, anxious, mild panic attack on Friday, pt starting to shut down a little 3. ONSET: How long have you been feeling this way? (e.g., hours, days, weeks)     2 weeks and worsening 4. SEVERITY: How would you rate the level of anxiety? (e.g., 0 - 10; or mild, moderate, severe).     Moderate to severe 5. FUNCTIONAL IMPAIRMENT: How have these feelings affected your ability to do daily activities? Have you had more difficulty than usual doing your normal daily activities? (e.g., getting better, same, worse; self-care, school, work, interactions)     yes 6. HISTORY: Have you felt this way before? Have you ever been diagnosed with an anxiety problem in the past?  (e.g., generalized anxiety disorder, panic attacks, PTSD). If Yes, ask: How was this problem treated? (e.g., medicines, counseling, etc.)     no 7. RISK OF HARM - SUICIDAL IDEATION: Do you ever have thoughts of hurting or killing yourself? If Yes, ask:  Do you have these feelings now? Do you have a plan on how you would do this?     no 8. TREATMENT:  What has been done so far to treat this anxiety? (e.g., medicines, relaxation strategies). What has helped?     Medication but ran out 9. THERAPIST: Do you have a counselor or therapist? If Yes, ask: What is their name?     no 10. POTENTIAL TRIGGERS: Do you drink caffeinated beverages (e.g., coffee, colas, teas), and how much daily? Do you drink alcohol or use any drugs? Have you started any new medicines recently?       Coffee 11. PATIENT SUPPORT: Who is with you now? Who do you live with? Do you have family or friends who you can talk to?        yes 12. OTHER SYMPTOMS: Do you have any other symptoms? (e.g., feeling depressed, trouble concentrating, trouble sleeping, trouble breathing, palpitations or fast heartbeat, chest pain, sweating, nausea, or diarrhea)       Trouble sleeping, nausea, increased HR at times 13. PREGNANCY: Is there any chance you are pregnant? When was your last menstrual period?       na  Stopped taking anxiety Abilify  after running out of medication: 3 to 4 weeks. Offered pt appt to come in to be seen  instead of wait up to 3 days for rx refill: pt stated unable to get off work right now: please send message to follow up.  Protocols used: Anxiety and Panic Attack-A-AH

## 2024-03-04 NOTE — Telephone Encounter (Signed)
Noted. Rx was refilled.

## 2024-03-08 NOTE — Telephone Encounter (Signed)
 Spoke with Pt through MyChart on different encounter. Rx was received and pt will maintain upcoming appointment in December.

## 2024-03-19 ENCOUNTER — Other Ambulatory Visit: Payer: Self-pay | Admitting: Family Medicine

## 2024-03-19 DIAGNOSIS — F331 Major depressive disorder, recurrent, moderate: Secondary | ICD-10-CM

## 2024-03-22 ENCOUNTER — Other Ambulatory Visit: Payer: Self-pay | Admitting: Family Medicine

## 2024-03-22 DIAGNOSIS — Z1231 Encounter for screening mammogram for malignant neoplasm of breast: Secondary | ICD-10-CM

## 2024-03-23 ENCOUNTER — Ambulatory Visit
Admission: RE | Admit: 2024-03-23 | Discharge: 2024-03-23 | Disposition: A | Source: Ambulatory Visit | Attending: Family Medicine

## 2024-03-23 DIAGNOSIS — Z1231 Encounter for screening mammogram for malignant neoplasm of breast: Secondary | ICD-10-CM

## 2024-04-08 ENCOUNTER — Ambulatory Visit: Admitting: Family Medicine

## 2024-04-08 ENCOUNTER — Encounter: Payer: Self-pay | Admitting: Family Medicine

## 2024-04-08 VITALS — BP 112/61 | HR 72 | Temp 97.4°F | Ht 68.5 in | Wt 208.0 lb

## 2024-04-08 DIAGNOSIS — F17200 Nicotine dependence, unspecified, uncomplicated: Secondary | ICD-10-CM | POA: Diagnosis not present

## 2024-04-08 DIAGNOSIS — Z23 Encounter for immunization: Secondary | ICD-10-CM

## 2024-04-08 DIAGNOSIS — Z1211 Encounter for screening for malignant neoplasm of colon: Secondary | ICD-10-CM

## 2024-04-08 DIAGNOSIS — F419 Anxiety disorder, unspecified: Secondary | ICD-10-CM

## 2024-04-08 DIAGNOSIS — F331 Major depressive disorder, recurrent, moderate: Secondary | ICD-10-CM | POA: Diagnosis not present

## 2024-04-08 DIAGNOSIS — Z0000 Encounter for general adult medical examination without abnormal findings: Secondary | ICD-10-CM

## 2024-04-08 DIAGNOSIS — Z125 Encounter for screening for malignant neoplasm of prostate: Secondary | ICD-10-CM | POA: Diagnosis not present

## 2024-04-08 DIAGNOSIS — Z13820 Encounter for screening for osteoporosis: Secondary | ICD-10-CM

## 2024-04-08 DIAGNOSIS — L989 Disorder of the skin and subcutaneous tissue, unspecified: Secondary | ICD-10-CM | POA: Diagnosis not present

## 2024-04-08 DIAGNOSIS — Z79890 Hormone replacement therapy (postmenopausal): Secondary | ICD-10-CM

## 2024-04-08 DIAGNOSIS — Z789 Other specified health status: Secondary | ICD-10-CM

## 2024-04-08 LAB — PSA: PSA: 0.1 ng/mL (ref 0.10–4.00)

## 2024-04-08 MED ORDER — ESCITALOPRAM OXALATE 20 MG PO TABS: 20.0000 mg | ORAL_TABLET | Freq: Every day | ORAL | 3 refills | Status: AC

## 2024-04-08 MED ORDER — ARIPIPRAZOLE 2 MG PO TABS: 2.0000 mg | ORAL_TABLET | Freq: Every day | ORAL | 3 refills | Status: AC

## 2024-04-08 NOTE — Progress Notes (Addendum)
 Assessment & Plan   Assessment/Plan:     Assessment & Plan Major depressive disorder and generalized anxiety disorder Anxiety levels are well-controlled with current medication regimen. No side effects reported from aripiprazole  or escitalopram . Mood is stable. - Continue aripiprazole  2 mg daily - Continue escitalopram  20 mg daily - Refilled prescriptions for aripiprazole  and escitalopram  - Follow up in 3 months to assess mood  Hyperlipidemia Currently managed with atorvastatin 40 mg daily. No side effects reported. Atorvastatin is more potent and better tolerated than pravastatin . - Continue atorvastatin 40 mg daily - Refilled atorvastatin prescription  MTF Transition on Hormone replacement therapy Managed by endocrinologist. Recent adjustments made to estrogen levels. No testosterone  suppression or DHEA supplementation. - Continue current hormone replacement therapy as managed by endocrinologist  Hemorrhoids Intermittent hemorrhoids, not currently bothersome. Managed with hydrocortisone  cream, which is effective. - Continue hydrocortisone  cream as needed  Possible basal cell carcinoma of nose Lesion on nose present for 3-4 months, with characteristics suggestive of possible basal cell carcinoma. No pain or weeping. Dermatology referral recommended for biopsy due to cosmetic considerations and potential skin cancer. - Referred to dermatology for evaluation and possible biopsy of nasal lesion  Tobacco use Continues to smoke, which increases risk for osteoporosis. - Recommend cessation to reduce risk of systemic disease including heart disease, cancer, osteoporosis - Ordered bone density scan - Recommended OTC vitamin D  and calcium supplementation  General Health Maintenance Routine health maintenance discussed. Mammogram and PSA screening are up to date. Bone density scan recommended every two years. Vitamin D  and calcium supplementation discussed due to smoking history.  -  Administered flu and shingles vaccines - Ordered PSA test       Medications Discontinued During This Encounter  Medication Reason   pravastatin  (PRAVACHOL ) 80 MG tablet    ARIPiprazole  (ABILIFY ) 2 MG tablet Reorder   escitalopram  (LEXAPRO ) 20 MG tablet Reorder            Subjective:   Encounter date: 04/08/2024  Jaclyn Rowe is a 63 y.o. adult who has Tobacco use disorder; Encounter for long-term current use of high risk medication; Hormone replacement therapy (HRT); Female-to-female transgender person; Anxiety; HLD (hyperlipidemia); Tubulovillous adenoma of colon; External hemorrhoid, bleeding; Coronary artery disease; COPD (chronic obstructive pulmonary disease) (HCC); Aortic atherosclerosis; Encounter for monitoring continuous estrogen therapy; Vaccine counseling; Lesion of skin of nose; and Moderate episode of recurrent major depressive disorder (HCC) on their problem list..   She  has a past medical history of Anxiety, Depression, Hyperlipidemia, and Polyp, colonic..   She presents with chief complaint of Medical Management of Chronic Issues (Pt presents today for a med follow up. Pt states the medication has been working great. No specific question or concerns. Pt is not fasting,                              )  Discussed the use of AI scribe software for clinical note transcription with the patient, who gave verbal consent to proceed.  History of Present Illness Jaclyn Rowe is a 63 year old female who presents for follow-up on mood and medication management.  Mood and sleep disturbance - Anxiety levels stabilized after resuming aripiprazole  2 mg daily and escitalopram  20 mg daily - No noticeable side effects from psychiatric medications - Sleep is fairly well maintained  Hyperlipidemia management - Currently taking atorvastatin 40 mg daily, prescribed by cardiology, with no side effects - Previously on  pravastatin  80 mg daily and inquired  about potency difference - Takes daily baby aspirin  Hormone therapy monitoring - Estrogen levels monitored by endocrinology - Recent increase in estrogen dosage - Recent mammogram completed - Comprehensive laboratory tests performed four weeks ago for estrogen management  Tobacco use - Continues to smoke cigarettes  Osteoporosis screening - Does not take vitamin D  or calcium supplements - Uncertain if bone density scan has ever been performed  Colorectal cancer screening - No colonoscopy in five years and acknowledges being overdue  Hemorrhoidal symptoms - Occasional hemorrhoids, managed with hydrocortisone  cream - Symptoms exacerbated by spicy foods  Cutaneous lesion - Small lesion on nose present for three to four months - Lesion is non-painful and unchanged in appearance - No response to antifungal cream      04/08/2024   11:19 AM 02/21/2023    9:11 AM 05/30/2022    9:08 AM 02/26/2022    9:26 AM 12/15/2020    7:52 AM  Depression screen PHQ 2/9  Decreased Interest 0 0 1 1 0  Down, Depressed, Hopeless 0 0 1 1 0  PHQ - 2 Score 0 0 2 2 0  Altered sleeping 0 0 0 0 0  Tired, decreased energy 0 0 0 0 0  Change in appetite 0 0 0 0 0  Feeling bad or failure about yourself  0 1 1 1  0  Trouble concentrating 0 0 0 0 0  Moving slowly or fidgety/restless 0 0 0 0 0  Suicidal thoughts 0 0 0 0 0  PHQ-9 Score 0 1  3  3   0   Difficult doing work/chores Not difficult at all Not difficult at all Not difficult at all Not difficult at all Not difficult at all     Data saved with a previous flowsheet row definition      02/21/2023    9:11 AM 05/30/2022    9:08 AM 02/26/2022    9:26 AM 03/29/2020    3:46 PM  GAD 7 : Generalized Anxiety Score  Nervous, Anxious, on Edge 1 1 2  0  Control/stop worrying 1 1 2 1   Worry too much - different things 1 1 2 1   Trouble relaxing 1 1 1  0  Restless 0 0 1 0  Easily annoyed or irritable 0 0 0 0  Afraid - awful might happen 1 1 1 1   Total GAD 7  Score 5 5 9 3   Anxiety Difficulty Not difficult at all Not difficult at all Not difficult at all Not difficult at all   ROS  Past Surgical History:  Procedure Laterality Date   AUGMENTATION MAMMAPLASTY     BREAST ENHANCEMENT SURGERY Bilateral    COLON SURGERY     COSMETIC SURGERY     LAPAROSCOPIC COLON RESECTION     ORCHIECTOMY Bilateral 2013   VAGINOPLASTY  2013    Medications Ordered Prior to Encounter[1]  Family History  Problem Relation Age of Onset   Colon cancer Neg Hx    Esophageal cancer Neg Hx    Rectal cancer Neg Hx    Stomach cancer Neg Hx    Breast cancer Neg Hx     Social History   Socioeconomic History   Marital status: Married    Spouse name: Not on file   Number of children: 2   Years of education: Not on file   Highest education level: Master's degree (e.g., MA, MS, MEng, MEd, MSW, MBA)  Occupational History   Not on file  Tobacco Use   Smoking status: Every Day    Current packs/day: 1.50    Average packs/day: 1.5 packs/day for 38.0 years (57.0 ttl pk-yrs)    Types: Cigarettes    Passive exposure: Never   Smokeless tobacco: Never  Vaping Use   Vaping status: Never Used  Substance and Sexual Activity   Alcohol use: Yes    Alcohol/week: 15.0 standard drinks of alcohol    Types: 15 Standard drinks or equivalent per week    Comment: daily   Drug use: Not Currently   Sexual activity: Not on file  Other Topics Concern   Not on file  Social History Narrative   Patient is transgender.   Social Drivers of Health   Tobacco Use: High Risk (04/08/2024)   Patient History    Smoking Tobacco Use: Every Day    Smokeless Tobacco Use: Never    Passive Exposure: Never  Financial Resource Strain: Low Risk (04/07/2024)   Overall Financial Resource Strain (CARDIA)    Difficulty of Paying Living Expenses: Not hard at all  Food Insecurity: No Food Insecurity (04/07/2024)   Epic    Worried About Programme Researcher, Broadcasting/film/video in the Last Year: Never true    Ran Out  of Food in the Last Year: Never true  Transportation Needs: No Transportation Needs (04/07/2024)   Epic    Lack of Transportation (Medical): No    Lack of Transportation (Non-Medical): No  Physical Activity: Insufficiently Active (04/07/2024)   Exercise Vital Sign    Days of Exercise per Week: 1 day    Minutes of Exercise per Session: 10 min  Stress: Stress Concern Present (04/07/2024)   [employer] of Occupational Health - Occupational Stress Questionnaire    Feeling of Stress: Rather much  Social Connections: Socially Isolated (04/07/2024)   Social Connection and Isolation Panel    Frequency of Communication with Friends and Family: Once a week    Frequency of Social Gatherings with Friends and Family: Never    Attends Religious Services: Never    Database Administrator or Organizations: No    Attends Engineer, Structural: Not on file    Marital Status: Married  Catering Manager Violence: Not on file  Depression (PHQ2-9): Low Risk (04/08/2024)   Depression (PHQ2-9)    PHQ-2 Score: 0  Alcohol Screen: Low Risk (04/07/2024)   Alcohol Screen    Last Alcohol Screening Score (AUDIT): 3  Housing: Unknown (04/07/2024)   Epic    Unable to Pay for Housing in the Last Year: No    Number of Times Moved in the Last Year: Not on file    Homeless in the Last Year: No  Utilities: Not on file  Health Literacy: Not on file                                                                                                  Objective:  Physical Exam: BP 112/61   Pulse 72   Temp (!) 97.4 F (36.3 C)   Ht 5' 8.5 (1.74 m)   Wt 208 lb (94.3 kg)  SpO2 98%   BMI 31.17 kg/m    Physical Exam GENERAL: Alert, cooperative, well developed, no acute distress HEENT: Normocephalic, normal oropharynx, moist mucous membranes CHEST: Clear to auscultation bilaterally, no wheezes, rhonchi, or crackles CARDIOVASCULAR: Normal heart rate and rhythm, S1 and S2 normal without  murmurs ABDOMEN: Soft, non-tender, non-distended, without organomegaly, normal bowel sounds EXTREMITIES: No cyanosis or edema NEUROLOGICAL: Cranial nerves grossly intact, moves all extremities without gross motor or sensory deficit SKIN: Lesion on nose with telangiectasias and scaling, possible basal cell carcinoma   Physical Exam  MM 3D SCREEN BREAST W/IMPLANT BILATERAL Result Date: 03/29/2024 CLINICAL DATA:  Screening. EXAM: DIGITAL SCREENING BILATERAL MAMMOGRAM WITH IMPLANTS, CAD AND TOMOSYNTHESIS TECHNIQUE: Bilateral screening digital craniocaudal and mediolateral oblique mammograms were obtained. Bilateral screening digital breast tomosynthesis was performed. The images were evaluated with computer-aided detection. Standard and/or implant displaced views were performed. COMPARISON:  Previous exam(s). ACR Breast Density Category c: The breasts are heterogeneously dense, which may obscure small masses. FINDINGS: The patient has retropectoral implants. There are no findings suspicious for malignancy. IMPRESSION: No mammographic evidence of malignancy. A result letter of this screening mammogram will be mailed directly to the patient. RECOMMENDATION: Screening mammogram in one year. (Code:SM-B-01Y) BI-RADS CATEGORY  1: Negative. Electronically Signed   By: Alm Parkins M.D.   On: 03/29/2024 14:47    Recent Results (from the past 2160 hours)  PSA(Must document that pt has been informed of limitations of PSA testing.)     Status: None   Collection Time: 04/08/24 11:02 AM  Result Value Ref Range   PSA 0.10 0.10 - 4.00 ng/mL    Comment: Test performed using Access [employer]br[employer]ch PSA Assay, a parmagnetic partical, chemiluminecent immunoassay.        Beverley KATHEE Hummer, MD  I,Emily Lagle,acting as a scribe for Beverley KATHEE Hummer, MD.,have documented all relevant documentation on the behalf of Beverley KATHEE Hummer, MD.  LILLETTE Beverley KATHEE Hummer, MD, have reviewed all documentation for this visit. The  documentation on 04/08/2024 for the exam, diagnosis, procedures, and orders are all accurate and complete.     [1]  Current Outpatient Medications on File Prior to Visit  Medication Sig Dispense Refill   aspirin EC 81 MG tablet Take 81 mg by mouth daily.     atorvastatin (LIPITOR) 40 MG tablet Take 40 mg by mouth daily.     estradiol  (VIVELLE -DOT) 0.075 MG/24HR Place 1 patch onto the skin 2 (two) times a week.     Lidocaine -Hydrocort , Perianal, 3-0.5 % CREA APPLY TOPICALLY TO THE AFFECTED AREA IN THE MORNING AND AT BEDTIME 85 g 0   amoxicillin -clavulanate (AUGMENTIN ) 875-125 MG tablet Take 1 tablet by mouth every 12 (twelve) hours. (Patient not taking: Reported on 04/08/2024) 14 tablet 0   No current facility-administered medications on file prior to visit.

## 2024-04-09 ENCOUNTER — Ambulatory Visit: Payer: Self-pay | Admitting: Family Medicine

## 2024-04-13 DIAGNOSIS — F331 Major depressive disorder, recurrent, moderate: Secondary | ICD-10-CM | POA: Insufficient documentation

## 2024-04-13 DIAGNOSIS — L989 Disorder of the skin and subcutaneous tissue, unspecified: Secondary | ICD-10-CM | POA: Insufficient documentation

## 2024-04-19 NOTE — Telephone Encounter (Signed)
 Patient already scheduled for Dermatology

## 2024-05-14 ENCOUNTER — Encounter: Payer: Self-pay | Admitting: Internal Medicine

## 2024-05-28 ENCOUNTER — Ambulatory Visit

## 2024-05-28 VITALS — Ht 69.0 in | Wt 204.0 lb

## 2024-05-28 DIAGNOSIS — Z1211 Encounter for screening for malignant neoplasm of colon: Secondary | ICD-10-CM

## 2024-05-28 MED ORDER — NA SULFATE-K SULFATE-MG SULF 17.5-3.13-1.6 GM/177ML PO SOLN: 1.0000 | Freq: Once | ORAL | 0 refills | Status: AC

## 2024-05-28 NOTE — Progress Notes (Signed)

## 2024-05-31 ENCOUNTER — Encounter: Payer: Self-pay | Admitting: Internal Medicine

## 2024-06-03 ENCOUNTER — Ambulatory Visit (HOSPITAL_BASED_OUTPATIENT_CLINIC_OR_DEPARTMENT_OTHER)
Admission: RE | Admit: 2024-06-03 | Discharge: 2024-06-03 | Disposition: A | Source: Ambulatory Visit | Attending: Family Medicine | Admitting: Family Medicine

## 2024-06-03 DIAGNOSIS — Z13820 Encounter for screening for osteoporosis: Secondary | ICD-10-CM

## 2024-06-07 ENCOUNTER — Encounter: Admitting: Internal Medicine
